# Patient Record
Sex: Female | Born: 1935 | Race: White | Hispanic: No | Smoking: Never smoker
Health system: Southern US, Community
[De-identification: ages and names within clinical notes are randomized; demographics above are authoritative.]

## PROBLEM LIST (undated history)

## (undated) DIAGNOSIS — K219 Gastro-esophageal reflux disease without esophagitis: Secondary | ICD-10-CM

## (undated) DIAGNOSIS — F329 Major depressive disorder, single episode, unspecified: Secondary | ICD-10-CM

## (undated) DIAGNOSIS — F32A Depression, unspecified: Secondary | ICD-10-CM

## (undated) DIAGNOSIS — Z952 Presence of prosthetic heart valve: Secondary | ICD-10-CM

## (undated) DIAGNOSIS — I5022 Chronic systolic (congestive) heart failure: Secondary | ICD-10-CM

## (undated) DIAGNOSIS — I10 Essential (primary) hypertension: Secondary | ICD-10-CM

## (undated) DIAGNOSIS — E785 Hyperlipidemia, unspecified: Secondary | ICD-10-CM

## (undated) HISTORY — PX: AORTIC VALVE REPLACEMENT: SHX41

## (undated) HISTORY — PX: PARATHYROID EXPLORATION: SHX732

## (undated) HISTORY — PX: ABDOMINAL HYSTERECTOMY: SHX81

## (undated) HISTORY — PX: BREAST LUMPECTOMY: SHX2

---

## 2014-05-31 ENCOUNTER — Other Ambulatory Visit: Payer: Self-pay | Admitting: Physician Assistant

## 2014-05-31 ENCOUNTER — Inpatient Hospital Stay: Payer: Self-pay | Admitting: Family Medicine

## 2014-05-31 DIAGNOSIS — I350 Nonrheumatic aortic (valve) stenosis: Secondary | ICD-10-CM

## 2014-05-31 DIAGNOSIS — D649 Anemia, unspecified: Secondary | ICD-10-CM

## 2014-05-31 DIAGNOSIS — I495 Sick sinus syndrome: Secondary | ICD-10-CM

## 2014-05-31 DIAGNOSIS — R7989 Other specified abnormal findings of blood chemistry: Secondary | ICD-10-CM

## 2014-05-31 DIAGNOSIS — I10 Essential (primary) hypertension: Secondary | ICD-10-CM

## 2014-05-31 DIAGNOSIS — I340 Nonrheumatic mitral (valve) insufficiency: Secondary | ICD-10-CM

## 2014-06-16 ENCOUNTER — Other Ambulatory Visit: Payer: Self-pay | Admitting: Family Medicine

## 2014-08-16 NOTE — Consult Note (Signed)
Hgb 10.2, no observed bleeding she has decided not to have any diagnostic studies for her rectal bleeding and is aware she could have a neoplasm in her bowel.   I wlll sign off, reconsult if she changes her mind.   Electronic Signatures: Scot JunElliott, Robert T (MD)  (Signed on 16-Feb-16 10:43)  Authored  Last Updated: 16-Feb-16 10:43 by Scot JunElliott, Robert T (MD)

## 2014-08-16 NOTE — Discharge Summary (Signed)
PATIENT NAME:  [name] Townsend, [name] Townsend MR#:  [id number] DATE OF BIRTH:  [birth year]  DATE OF ADMISSION:  05/31/2014 DATE OF DISCHARGE:  06/03/2014  DISCHARGE DIAGNOSES:  1. Acute right ankle fracture that is casted.  2. Acute anemia secondary to gastrointestinal bleed that is stable.  3. Hypertension.  4. Gastroesophageal reflux disease.   DISCHARGE MEDICATIONS:  1. Citalopram 20 mg 1-1/2 tablets p.o. daily.  2. B12 1000 mcg p.o. daily.  3. Metoprolol tartrate 25 mg p.o. b.i.d.  4. Omeprazole 20 mg p.o. daily.  5. PreserVision 1 capsule daily.  6. Simvastatin 20 mg p.o. at bedtime.  7. Unisom 50 mg p.o. at bedtime as needed for insomnia.  8. Lisinopril 5 mg 1/2 tablet p.o. daily. 9. Folic acid 800 mcg p.o. daily. 10. Acetaminophen 325 mg 2 tablets every 6 hours as needed for pain and fever.   CONSULTS: Orthopedics.   PROCEDURES: None.   PERTINENT LABORATORY AND STUDIES ON THE DAY OF DISCHARGE: Hemoglobin of 10. Prior to discharge, sodium 137, potassium 3.7, creatinine 0.72, glucose 106. Hemoccult positive.   BRIEF HOSPITAL COURSE:  1. Acute right ankle fracture. The patient was evaluated by orthopedics and casted, nonweightbearing for 6 weeks per orthopedics.  2. Acute anemia. The patient has a history of a GI bleed over the past several weeks. Hemoglobin as low as 6.9, was transfused 2 units and hemoglobin has remained stable since that time, which is asymptomatic with no further bleeding. Hemoglobin on discharge was 10. This needs to be rechecked as an outpatient within the next 2 weeks.  3. All her chronic issues are stable at this time. Continue with home regimen, although we are going to hold all anticoagulants including aspirin.   DISPOSITION: She is in stable condition. Discharge to a skilled nursing facility for further rehabilitation. Will need PT and nursing.   She is a FULL CODE.   ____________________________ Marisue IvanKanhka Dynisha Due, MD kl:JT D: 06/03/2014 08:41:56  ET T: 06/03/2014 09:38:17 ET JOB#: [id number]  cc: Marisue IvanKanhka Takelia Urieta, MD, <Dictator> Marisue IvanKANHKA Buffie Herne MD ELECTRONICALLY SIGNED 06/24/2014 16:38

## 2014-08-16 NOTE — Consult Note (Signed)
Pt denies any further bleeding today, eating and in good spirits. she does not know what she wants to do about diagnostic exam for the bleeding.  hgb up to 9.8 with 2 units of blood.  I will see tomorrow, she may want to not do any diagnostic tests.  Electronic Signatures: Scot JunElliott, Robert T (MD)  (Signed on 15-Feb-16 17:38)  Authored  Last Updated: 15-Feb-16 17:38 by Scot JunElliott, Robert T (MD)

## 2014-08-16 NOTE — Consult Note (Signed)
General Aspect Primary Cardiologist: [facility] _______________  81 year old female with history of NICM/chronic systolic CHF with EF 40%, (cath 06/2013), aortic stenosis s/p TAVR 06/2013, HLD, and mood disorder who presented to [facility] on 2/13 with increased dizziness, falls, and intermittent BRBPR (longstanding issue). On 2/13 she fell in the bathroom after feeling dizzy and lightheaded. She suffered a right ankle fracture. In the ED she was found to have a hgb of 7.9-->6.9 (previously 10.2 on 12/2013) and an incidental troponin of 0.06 x 3 without chest pain.  _____________  PMH: 1. NICM/chronic systolic CHF with EF 98%, (cath 06/2013) 2. Aortic stenosis s/p TAVR 06/2013 (MDT 26 mm Core Valve) 3. HLD 4. Mood disorder _______________   Present Illness 81 year old female with the above problem list who presented to [facility] on 2/13 with increased dizziness, falls, and intermittent BRBPR.  She is followed by [facility]. She has known NICM with cardiac cath 06/2013 as part of her TAVR work up showing left main normal, LAD mild luminal irregs (large caliber vessel), LCx mild luminal irregs (large caliber vessel), RCA mild plaque (large caliber vessel). Moderate pulmonary HTN with PAP at 37 mm Hg, PCWP 24 mm Hg. She underwent successful TAVR in 06/2013 which improved her NYHA Class IV symptoms to Class I per [facility] notes. Post procedure she did develop a seroma at the procedure site that is now resolved. She was to be on Plavix for 3 months per [facility] notes, then transition to aspirin 81 mg daily. She continues to take Plavix 75 mg daily at this time. Echo 06/2013 showed TAVR, LVH, EF 45%, mild MR, dilated LA/RA. She has been found to be in sinus tachycardia at her last outpatient cardiology visits. Her bb was increased. She was found to be well compensated from a HF standpoint at that time, thus she was not placed on Lasix per [facility] notes.   Over the last several weeks she has noted increased dizziness with ambulation. She  does walk with a walker. She denies any symptoms with positional changes or while at rest. She denies any chest pain, diaphoresis, nausea, vomiting, or flushing. No palpitations. She has at times had presyncopal/questionable syncopal episodes. She also notes increased SOB over the past several weeks. No orthopnea or early satiety. Her weight has increased by about 15 pounds over the past 3-4 months. No changes in her chronic lower extremity edema. No abdominal swelling. No cough.   She has a long history of BRBPR that she reports is 2/2 hemorrhoids. No formal evaluation. She apparently had a massive bleed within the past week.  While at home on 2/13 she felt dizzy in the bathroom and fell. Denied any chest pain, SOB, diaphoresis, nausea, vomiting, or flushing. She suffered a fractured right ankle. Upon her arrival to the ED she was found to have a hgb of 7.9-->6.9. Troponin 0.06 x 3 (chest pain free). CXR with no acute process, changes 2/2 AVR. She was also noted to have brusing of the right ankle. She was seen by ortho who felt surgery was not indicated and placed ankle in a cast. Cardiology was consulted for further evaluation. She is currently resting comfortably in her room, symptom free.   Physical Exam:  GEN well developed, no acute distress   HEENT HOH   NECK supple   RESP normal resp effort  clear BS   CARD Regular rate and rhythm  No murmur   ABD denies tenderness  soft   LYMPH negative neck  EXTR negative edema, right ankle in cast   SKIN normal to palpation   NEURO motor/sensory function intact   PSYCH alert, A+O to time, place, person, good insight   Review of Systems:  General: No Complaints   Skin: No Complaints   ENT: No Complaints   Eyes: No Complaints   Neck: No Complaints   Respiratory: Short of breath   Cardiovascular: No Complaints   Gastrointestinal: No Complaints   Genitourinary: No Complaints   Vascular: No Complaints   Musculoskeletal:  Muscle or joint pain   Neurologic: Dizzness  Fainting   Hematologic: No Complaints   Endocrine: No Complaints   Psychiatric: No Complaints   Review of Systems: All other systems were reviewed and found to be negative   Medications/Allergies Reviewed Medications/Allergies reviewed   Family & Social History:  Family and Social History:  Family History Negative  mother: breast ca and parkinson's; father leukemia   Social History negative tobacco, positive ETOH, negative Illicit drugs   Place of Living Nursing Home     hypertension:    depression:    aortic valve TAVAR procedure:   Home Medications: Medication Instructions Status  Tylenol 500 mg oral tablet  orally once a day Active  Aspirin Enteric Coated 81 mg oral delayed release tablet 1 tab(s) orally once a day Active  citalopram 20 mg oral tablet 1.5 tab(s) orally once a day Active  clopidogrel 75 mg oral tablet 1 tab(s) orally once a day Active  cyanocobalamin 1000 mcg oral tablet 1 tab(s) orally once a day Active  Metoprolol Tartrate 25 mg oral tablet 1 tab(s) orally 2 times a day Active  naproxen sodium 220 mg oral capsule 2 tab(s) orally once a day, As Needed Active  omeprazole 20 mg oral delayed release capsule 1 cap(s) orally once a day Active  permethrin topical 5% topical cream Apply topically to affected area once Active  PreserVision AREDS 2 Antioxidant Multiple Vitamins and Minerals oral capsule 1 cap(s) orally once a day Active  simvastatin 20 mg oral tablet 1 tab(s) orally once a day (at bedtime) Active  triamcinolone topical 0.5% topical cream Apply topically to affected area 2 times a day Active  Unisom 50 milligram(s) orally once a day (at bedtime), As Needed Active  zinc gluconate 50 mg oral tablet  orally once a day Active  lisinopril 5 mg oral tablet 0.5 tab(s) orally once a day Active  folic acid 026 microgram(s) orally once a day Active   Lab Results:  Thyroid:  14-Feb-16 03:19   Thyroid  Stimulating Hormone 1.94 (0.45-4.50 (IU = International Unit)  ----------------------- Pregnant patients have  different reference  ranges for TSH:  - - - - - - - - - -  Pregnant, first trimetser:  0.36 - 2.50 uIU/mL)  Routine Chem:  14-Feb-16 03:19   Glucose, Serum  105  BUN 17  Creatinine (comp) 0.73  Sodium, Serum 143  Potassium, Serum 3.5  Chloride, Serum  108  CO2, Serum 25  Calcium (Total), Serum  7.7  Anion Gap 10  Osmolality (calc) 287  eGFR (African American) >60  eGFR (Non-African American) >60 (eGFR values <36m/min/1.73 m2 may be an indication of chronic kidney disease (CKD). Calculated eGFR, using the MRDR Study equation, is useful in  patients with stable renal function. The eGFR calculation will not be reliable in acutely ill patients when serum creatinine is changing rapidly. It is not useful in patients on dialysis. The eGFR calculation may not be applicable to patients  at the low and high extremes of body sizes, pregnant women, and vegetarians.)  Cardiac:  13-Feb-16 13:16   Troponin I  0.06 (0.00-0.05 0.05 ng/mL or less: NEGATIVE  Repeat testing in 3-6 hrs  if clinically indicated. >0.05 ng/mL: POTENTIAL  MYOCARDIAL INJURY. Repeat  testing in 3-6 hrs if  clinically indicated. NOTE: An increase or decrease  of 30% or more on serial  testing suggests a  clinically important change)    17:05   Troponin I  0.06 (0.00-0.05 0.05 ng/mL or less: NEGATIVE  Repeat testing in 3-6 hrs  if clinically indicated. >0.05 ng/mL: POTENTIAL  MYOCARDIAL INJURY. Repeat  testing in 3-6 hrs if  clinically indicated. NOTE: An increase or decrease  of 30% or more on serial  testing suggests a  clinically important change)  Routine Hem:  14-Feb-16 03:19   WBC (CBC) 10.8  RBC (CBC)  3.34  Hemoglobin (CBC)  6.9  Hematocrit (CBC)  22.4  Platelet Count (CBC) 385  MCV  67  MCH  20.5  MCHC  30.6  RDW  18.4  Neutrophil % 71.4  Lymphocyte % 11.8  Monocyte % 10.8   Eosinophil % 5.2  Basophil % 0.8  Neutrophil #  7.7  Lymphocyte # 1.3  Monocyte #  1.2  Eosinophil # 0.6  Basophil # 0.1 (Result(s) reported on 31 May 2014 at 04:00AM.)   EKG:  EKG Interp. by me   Interpretation EKG pending. tele shows NSR with APCs   Radiology Results:  XRay:    13-Feb-16 09:54, Chest PA and Lateral  Chest PA and Lateral   REASON FOR EXAM:    sob  COMMENTS:       PROCEDURE: DXR - DXR CHEST PA (OR AP) AND LATERAL  - May 30 2014  9:54AM     CLINICAL DATA:  Dizziness for 1 week, fell, ankle fracture    EXAM:  CHEST - 2 VIEW    COMPARISON:  None available    FINDINGS:  Changes of percutaneous AVR. Atheromatous aorta. Lungs are clear.  Heart size and mediastinal contours are within normal limits.  No effusion.  No pneumothorax.  Mild degenerative changes in the lower thoracic spine. Surgical  clips anteriorly at the thoracic inlet.     IMPRESSION:  1. No acute cardiopulmonary disease.  2. Postop changes as above.      Electronically Signed    By: Lucrezia Europe M.D.    On: 05/30/2014 10:06         Verified By: Kandis Cocking, M.D.,    13-Feb-16 13:02, Ankle Right Complete  Ankle Right Complete   REASON FOR EXAM:    post-reduction fracture evaluation  COMMENTS:   Bedside (portable):Y    PROCEDURE: DXR - DXR ANKLE RIGHT COMPLETE  - May 30 2014  1:02PM     CLINICAL DATA:  Status post reduction.    EXAM:  RIGHT ANKLE - COMPLETE 3+ VIEW    COMPARISON:  05/30/2014    FINDINGS:  The ankle is imaged through splinting material. Again noted are  fractures of the lateral and medial malleolus. There has been  interval reduction of medial malleolus. No new fractures are  identified.     IMPRESSION:  Close reduction of bimalleolar fractures      Electronically Signed    By: Nolon Nations M.D.    On: 05/30/2014 13:31         Verified By: Glenice Bow, M.D.,  Cardiology:    14-Feb-16  10:07, Echo Doppler  Echo Doppler   REASON  FOR EXAM:      COMMENTS:       PROCEDURE: [facility] - ECHO DOPPLER COMPLETE(TRANSTHOR)  - May 31 2014 10:07AM     RESULT: Echocardiogram Report    Patient Name:   [name] Townsend Date of Exam: 05/31/2014  Medical Rec #:  [id number]           Custom1:  Date of Birth:  [birth year]        Height:       64.0 in  Patient Age:    25 years         Weight:       150.0 lb  Patient Gender: F                BSA:          1.73 m??    Indications: CHF  Sonographer:    Arville Go RDCS  Referring Phys: Dion Body    Summary:   1. Left ventricular ejection fraction, by visual estimation, is 60 to   65%.   2. Normal global left ventricular systolic function.   3. Impaired relaxation pattern of LV diastolic filling.   4. Mild left ventricular hypertrophy.   5. Normal right ventricular size and systolic function.   6. Mild mitral valve regurgitation.   7. Mild to moderate tricuspid regurgitation.   8. Mildly elevated pulmonary artery systolic pressure.  2D AND M-MODE MEASUREMENTS (normal ranges within parentheses):  Left Ventricle:          Normal  IVSd (2D):      1.16 cm (0.7-1.1)  LVPWd (2D):     1.22 cm (0.7-1.1) Aorta/LA:                  Normal  LVIDd (2D):     4.22 cm (3.4-5.7) Aortic Root (2D): 2.20 cm (2.4-3.7)  LVIDs (2D):     2.92 cm       Left Atrium (2D): 3.60 cm (1.9-4.0)  LV FS (2D):     30.8 %   (>25%)  LV EF (2D):     58.8 %   (>50%)                                    Right Ventricle:                                    RVd (2D):  LV DIASTOLIC FUNCTION:  MV Peak E: 1.14 m/sE/e' Ratio: 19.50  MV Peak A: 1.47 m/s Decel Time: 201 msec  E/A Ratio: 0.78  SPECTRAL DOPPLER ANALYSIS (where applicable):  Mitral Valve:  MV P1/2 Time: 58.29 msec  MV Area, PHT: 3.77 cm??  Aortic Valve: AoV Max Vel: 2.04 m/s AoV Peak PG: 16.6 mmHg AoV Mean PG:   8.0 mmHg  LVOT Vmax: 1.12 m/s LVOT VTI:  LVOT Diameter: 1.80 cm  AoV Area, Vmax: 1.40 cm?? AoV Area, VTI:  AoV Area, Vmn:  Tricuspid Valve  and PA/RV Systolic Pressure: TR Max Velocity: 3.04 m/s RA   Pressure: 10 mmHg RVSP/PASP: 47.0 mmHg    PHYSICIAN INTERPRETATION:  Left Ventricle: The left ventricular internal cavity size was normal. LV   posterior wall thickness was normal. Mild left ventricular hypertrophy.   Global LV systolic function was normal. Left ventricular  ejection   fraction, by visual estimation, is 60 to 65%. Spectral Doppler shows   impaired relaxation pattern of LV diastolic filling.  Right Ventricle: Normal right ventricular size, wall thickness, and   systolic function. The right ventricular size is normal. Global RV     systolic function is normal.  Left Atrium: The left atrium is normal in size.  Right Atrium: The right atrium is normal in size.  Pericardium: There is no evidence of pericardial effusion.  Mitral Valve: The mitral valve is normal in structure. Mild mitral valve   regurgitation is seen.  Tricuspid Valve: The tricuspid valve is normal. Mild to moderate   tricuspid regurgitation is visualized. The tricuspid regurgitant velocity   is 3.04 m/s, and with an assumed right atrial pressure of 10 mmHg, the   estimated right ventricular systolic pressure is mildly elevated at 47.0   mmHg.  Aortic Valve: The aortic valve is normal. The aortic valve is   structurally normal, with no evidence of sclerosis or stenosis. No   evidence of aortic valve regurgitation is seen.  Pulmonic Valve: The pulmonic valve is normal. Trace pulmonic valve     regurgitation.  Aorta: The aortic root and ascending aorta are structurally normal, with   no evidence of dilitation.    [id number] Ida Rogue MD  Electronically signed by [id number] Ida Rogue MD  Signature Date/Time: 05/31/2014/10:54:58 AM    *** Final ***    IMPRESSION: .        Verified By: Minna Merritts, M.D., MD    Codeine: N/V/Diarrhea  Vital Signs/Nurse's Notes:  **Vital Signs.:   14-Feb-16 08:44  Vital Signs Type Q 4hr  Temperature  Temperature (F) 98  Celsius 36.6  Temperature Source oral  Pulse Pulse 116  Respirations Respirations 18  Systolic BP Systolic BP 196  Diastolic BP (mmHg) Diastolic BP (mmHg) 72  Mean BP 86  Pulse Ox % Pulse Ox % 92  Pulse Ox Activity Level  At rest  Oxygen Delivery Room Air/ 21 %    Impression 81 year old female with history of NICM/chronic systolic CHF with EF 22%, (cath 06/2013), aortic stenosis s/p TAVR 06/2013, HLD, and mood disorder who presented to [facility] on 2/13 with increased dizziness, falls, and intermittent BRBPR (longstanding issue). On 2/13 she fell in the bathroom after feeling dizzy and lightheaded. She suffered a right ankle fracture. In the ED she was found to have a hgb of 7.9-->6.9 (previously 10.2 on 12/2013) and an incidental troponin of 0.06 x 3 without chest pain.  1. Elevated troponin: -Likely demand ischemia in the setting of her anemia (last known hgb 10.2 in 12/2013, presented with hgb of 7.9-->6.9) -Recent cardiac cath 06/2013 at [facility] as part of her TAVR evaluation that showed no significant coronary disease  Normal LV function and wall motion  No further workup at this time  2. Dizziness:  patient on telemetry to monitor for significant arrhythmia, if none seen would schedule outpt follow up.  If sx persist, we would recommend 30 day monitoring  could check orthostatics for continue sx. -Check TSH  3. Anemia: -Last known hgb 10.2 (12/2013), currently 7.9-->6.9 -Massive BRBPR within the past 1 week (long standing h/o hemorrhoids per patient) -Consider GI evaluation  -Consider transfusion to >9 followed by monitoring for stability   4. Aortic stenosis s/p TAVR 06/2013: -Per [facility] notes patient was to stop Plavix 3 months after her procedure, this was delayed 2/2 the development of her seroma. However she did  ultimately complete this in August 2015. Notes are unclear if her primary team wants her on Plavix or not.  -Holding Plavix at the current time 2/2 the above  significant acute anemia -Defer Plavix to primary cardiology team at [facility]  5. Sinus tachycardia: -Likely 2/2 her anemia -Monitor on tele  6. HTN: -Lopressor and lisinopril as above  7. Hypokalemia: -Replete to 4.0   Electronic Signatures: Christell Faith M (PA-C)  (Signed 14-Feb-16 09:01)  Authored: General Aspect/Present Illness, History and Physical Exam, Review of System, Family & Social History, Past Medical History, Home Medications, Labs, EKG , Radiology, Allergies, Vital Signs/Nurse's Notes, Impression/Plan Ida Rogue (MD)  (Signed 14-Feb-16 12:39)  Authored: General Aspect/Present Illness, History and Physical Exam, Family & Social History, Labs, EKG , Radiology, Vital Signs/Nurse's Notes, Impression/Plan  Co-Signer: General Aspect/Present Illness, History and Physical Exam, Review of System, Family & Social History, Past Medical History, Home Medications, Labs, EKG , Radiology, Allergies, Vital Signs/Nurse's Notes, Impression/Plan   Last Updated: 14-Feb-16 12:39 by Ida Rogue (MD)

## 2014-08-16 NOTE — Consult Note (Signed)
PATIENT NAME:  [name] Townsend Townsend, [name] Townsend MR#:  [id number] DATE OF BIRTH:  November 04, 1919  DATE OF CONSULTATION:  05/31/2014  CONSULTING PHYSICIAN:  Scot Junobert T. Azir Muzyka, MD  HISTORY OF PRESENT ILLNESS: The patient is a 81 year old, white female who has had some dizzy spells, fell and has a bimalleolar a fracture of her right ankle. She was also found to have hypochromic microcytic anemia with a hemoglobin of 6.9. She is getting transfused at the time of the interview and the exam. The patient says she has had bright red blood per rectum for several months and thought it was hemorrhoids and did not pay too much attention to it. She did not have abdominal pain with this nor nausea or vomiting. She has been taking Plavix 75 mg a day and aspirin 81 mg a day and Aleve 220 mg once or twice a day which could all have contributed to iron deficiency anemia. She had a colonoscopy, she thinks. 20 years ago or so and has not had one since and does not wish to have a colonoscopy with prep. The patient had an arteriographic procedure with aortic valve replacement via femoral artery access and that is why she is on Plavix and aspirin.   ALLERGIES: CODEINE.  MEDICATIONS:  Aspirin 81 mg a day, Celexa 30 mg a day, Plavix 75 mg a day, vitamin B12 - 1000 mcg a day, folic acid 1 mg a day, lisinopril 5 mg a day, metoprolol 25 mg b.i.d., Naprosyn 220 mg 2 tabs daily as needed, omeprazole 20 mg a day, Tylenol 500 mg a day, Unisom 50 mg at bedtime and zinc gluconate 50 mg a day.   PHYSICAL EXAMINATION: GENERAL: Pleasant, elderly, white female in no acute distress.   HEENT: Sclerae anicteric. Conjunctivae slightly pale.   CHEST: Clear anterior fields.   HEART: Shows a questionable 1/6 systolic murmur.   ABDOMEN: Nontender. No hepatosplenomegaly.   VITAL SIGNS: Temperature 97.3, pulse 95, respirations 20, blood pressure 174/70.   LABORATORY DATA: Troponin slightly elevated at 0.06, felt likely to be possibly demand ischemia given her  anemia. TSH 1.94. Hemoglobin 7.9, repeat is 6.9. She has hypochromic microcytic indices. O positive blood with negative antibody screen. Protime is 14.5, INR 1.1 and she has heme-positive stool.   ASSESSMENT: Iron deficiency anemia in a patient with bleeding for several weeks or months, bright red blood per rectum, thought to be hemorrhoidal outlet bleeding. She could certainly have become anemic from bleeding this much over a long period of time from hemorrhoids, especially given the effects of Plavix, aspirin and Naprosyn. It is certainly possible that she has a neoplasm or large polyp in the lower gastroinestinal tract. She does not want to do a colonoscopy with the usual prep that is involved with it. She thinks she might wish to do a flexible sigmoidoscopy with a Fleet's Enema. I talked to her and her daughter about this possibility. They are going to think on it and I will check back with her tomorrow.    ____________________________ Scot Junobert T. Amyia Lodwick, MD rte:TT D: 05/31/2014 16:57:20 ET T: 05/31/2014 18:15:03 ET JOB#: [id number]  cc: Scot Junobert T. Bayle Calvo, MD, <Dictator> Scot JunOBERT T Mickal Meno MD ELECTRONICALLY SIGNED 06/20/2014 10:50

## 2014-08-16 NOTE — Consult Note (Signed)
Pt with Aortic valve replaced via femoral artery and was on asprin 81mg  and plavix, taking NSAID daily.  Had weakness and dizzyness possibly from worsening anemia from the recurrent daily bleeding with BRB per rectum going on for several weeks.  I discussed possible diagnostic options, she is not interested in a complete colonoscopy and the clean out it woud require but may consider a flex sig with fleets enema. hold plavix and continue ASA 81mg .  Agree with transfusion up o 8 or 9 slowly due to age.  Will check by in a  day or two and see what she wants to do. It is also possible that the NSAID (naprosyn) may contribute to chronic blood loss.  Aortic valve disease may cause RBC to wear out quicker than otherwise, causing iron def.  Electronic Signatures: Scot JunElliott, Robert T (MD)  (Signed on 14-Feb-16 16:51)  Authored  Last Updated: 14-Feb-16 16:51 by Scot JunElliott, Robert T (MD)

## 2014-08-16 NOTE — H&P (Signed)
PATIENT NAME:  [name] Townsend, [name] Townsend MR#:  [id number] DATE OF BIRTH:  1920/03/11  DATE OF ADMISSION:  05/30/2014  PRIMARY CARE PHYSICIAN: Marisue Ivan, MD   CHIEF COMPLAINT: Presyncope and a fall.   HISTORY OF PRESENT ILLNESS: This is a 81 year old female who had a fall earlier today in the bathroom. The patient said she felt sort of dizzy and lightheaded. She has been feeling somewhat dizzy for the past couple of days. Her daughter, who lives very close by to her, was actually staying with her. She heard her mother fall, and she was brought to the ER for further evaluation. In the Emergency Room, the patient was noted to have a right ankle fracture. Incidentally, she was also noted to have an elevated troponin and also noted to be anemic. Hospitalist services were contacted for further treatment and evaluation. The patient denies any abdominal pain, any chest pain. Admits to some nausea, but no vomiting. No headache, no numbness, tingling. No other associated symptoms presently.   REVIEW OF SYSTEMS:  CONSTITUTIONAL: No documented fever. No weight gain, no weight loss.  EYES: No blurry or double vision.  EARS, NOSE, THROAT: No tinnitus. No postnasal drip. No redness of the oropharynx. RESPIRATORY: No cough, no wheeze, no hemoptysis, no dyspnea.  CARDIOVASCULAR: No chest pain, no orthopnea, no palpitations, no true syncope.  GASTROINTESTINAL: Positive nausea. No vomiting. No diarrhea. No abdominal pain. No melena, no hematochezia.  GENITOURINARY: No dysuria or hematuria.  ENDOCRINE: No polyuria or nocturia. No heat or cold intolerance.  HEMATOLOGIC: No anemia, no bruising, no bleeding.  INTEGUMENTARY: No rashes. No lesions.  MUSCULOSKELETAL: No arthritis, no swelling, no gout.  NEUROLOGIC: No numbness or tingling. No ataxia. No seizure-type activity.  PSYCHIATRIC: No anxiety, no insomnia, no ADD. Positive depression.   PAST MEDICAL HISTORY: Consistent with hypertension, hyperlipidemia, GERD,  depression, history of recent aortic valve replacement.   ALLERGIES: CODEINE, WHICH CAUSES NAUSEA AND VOMITING.   SOCIAL HISTORY: No smoking. Occasional alcohol use. No illicit drug abuse. Lives by herself.   FAMILY HISTORY: Mother and father are both deceased. Mother died from breast cancer, and she had Parkinson. Father died from leukemia.   CURRENT MEDICATIONS: As follows: Aspirin 81 mg daily, Celexa 30 mg daily, Plavix 75 mg daily, vitamin B12 1000 mcg daily, folic acid 1 mg daily, lisinopril 5 mg daily, metoprolol tartrate 25 mg b.i.d., Naprosyn 220 mg 2 tablets daily as needed, omeprazole 20 mg daily, permethrin topical to be applied to the affected area as needed, PreserVision 1 tablet daily, simvastatin 20 mg daily, triamcinolone topical cream to be applied to the affected area b.i.d., Tylenol 500 mg daily, Unisom 50 mg daily at bedtime as needed, and zinc gluconate 50 mg daily.   PHYSICAL EXAMINATION: Presently is as follows:  VITAL SIGNS: Noted to be: Temperature is 97.4, pulse 75, respirations 20, blood pressure 159/107, saturations 94% on room air.  GENERAL: She is a pleasant -appearing female in no apparent distress.  HEAD, EYES, EARS, NOSE, AND THROAT: Atraumatic, normocephalic. Extraocular muscles are intact. Pupils equal and reactive on to light. Sclerae anicteric. No conjunctival injection. No pharyngeal erythema.  NECK: Supple. There is no jugular venous distention. No bruits. No lymphadenopathy. No thyromegaly.  HEART: Regular rate and rhythm. No murmurs, no rubs, no clicks.  LUNGS: Clear to auscultation bilaterally. No rales, no rhonchi, no wheezes.  ABDOMEN: Soft, flat, nontender, nondistended. Has good bowel sounds. No hepatosplenomegaly appreciated.   EXTREMITIES: No evidence of cyanosis, clubbing. Trace pedal edema bilaterally. Right  ankle is somewhat swollen.  NEUROLOGICAL: The patient is alert, awake, and oriented x 3 with no focal motor or sensory deficits appreciated  bilaterally.  SKIN: Moist and warm with no rashes.  LYMPHATIC: There is no cervical or axillary lymphadenopathy.    LABORATORY DATA: Serum glucose of 150, BUN 15, creatinine 0.7, sodium 139, potassium 3.7, chloride 106, bicarbonate 24. The patient's LFTs are within normal limits. Troponin 0.06. White cell count 10.2, hemoglobin 7.9, hematocrit 26.5, platelet count 425,000. MCV is 68.   IMAGING STUDIES: The patient did have a chest x-ray done, which showed no acute cardiopulmonary disease. The patient also had an x-ray of the right ankle, which showed unstable bimalleolar fracture with lateral subluxation of the talus.   ASSESSMENT AND PLAN: This is a 81 year old female with a history of hypertension, hyperlipidemia, gastroesophageal reflux disease, depression, history of recent aortic valve replacement, who presents to the hospital after a presyncopal episode/fall and noted to have a right ankle fracture, also incidentally noted to have elevated troponin and also noted to be anemic.  1. Status post fall and right ankle fracture. The patient has already been seen by orthopedics and will likely get a cast placed. Continue pain control as per orthopedics. Most likely, as per orthopedics, she may not need surgery.  2. Elevated troponin. The patient has no acute chest pain. This is likely demand ischemia from the fall. There is no evidence of acute coronary syndrome. EKG shows no acute ST or T-wave changes. We will observe her on off unit telemetry, follow serial cardiac markers. Continue her aspirin, beta blocker, statin, and ACE inhibitor. Hold her Plavix , given her anemia.  3. Anemia. Questionable if this is a chronic or acute. I have no previous hemoglobin to compare with. This is a microcytic anemia. We will check iron TIBC, ferritin, and reticulocyte count. We will also check a Hemoccult. There is no urgent need for transfusion at this time. We will follow serial hemoglobins.  4. Hypertension. Continue  metoprolol and lisinopril.  5. Gastroesophageal reflux disease. Continue Protonix.  6. Hyperlipidemia. Continue simvastatin.  7. Depression. Continue Celexa.   CODE STATUS: The patient is a full code.   TIME SPENT: 50 minutes    ____________________________ Rolly PancakeVivek J. Cherlynn KaiserSainani, MD vjs:mw D: 05/30/2014 12:24:11 ET T: 05/30/2014 13:08:47 ET JOB#: [id number]  cc: Rolly PancakeVivek J. Cherlynn KaiserSainani, MD, <Dictator> Houston SirenVIVEK J SAINANI MD ELECTRONICALLY SIGNED 06/17/2014 15:29

## 2014-08-16 NOTE — Consult Note (Signed)
PATIENT NAME:  [name] Townsend, [name] Townsend MR#:  [id number] DATE OF BIRTH:  October 27, 1919  DATE OF CONSULTATION:  05/30/2014  REASON FOR CONSULTATION: Right ankle fracture subluxation.   HISTORY OF PRESENT ILLNESS: Ms. [name] Townsend is a 81 year old female, who lives in assisted living. The patient had a syncopal episode at home. During the fall, she sustained an injury to the right ankle. She denies any other injuries. She has baseline peripheral neuropathy. The patient states that she has had multiple falls recently including one last week, where she injured the same ankle, but since that time has been able to bear weight on it.   PAST SURGICAL HISTORY: Includes hypertension, depression and an aortic valve replacement procedure.   HOME MEDICATIONS: Include zinc gluconate 50 mg daily, Unisom 50 mg daily, Tylenol 500 mg once daily, triamcinolone topical, which she uses to the affected area b.i.d., simvastatin 20 mg daily, PreserVision AREDS 2  antioxidant multivitamin and mineral oil capsules 1 capsule daily, permethrin topical 5% applied topically once daily, omeprazole 20 mg daily, naproxen sodium 220 mg tablets, she takes 2 tablets daily as needed, metoprolol tartrate 25 mg 1 tablet b.i.d., lisinopril 5 mg daily, folic acid 1 mg daily,                               1000 mg 1 tablet daily, Plavix 75 mg daily, citalopram 20 mg tablet, 1.5 tablets daily, and enteric coated aspirin 81 mg daily.   ALLERGIES: CODEINE.   PHYSICAL EXAMINATION:  RIGHT ANKLE: The patient has external rotation deformity and swelling over the right ankle. The skin is intact. She can flex and extend her toes and has intact sensation to light touch, although she has baseline neuropathy, which has not worsened since the fall. She has palpable pedal pulses. Her foot and leg compartments are soft and compressible.   RADIOLOGY: X-ray films of the right ankle demonstrate a bimalleolar ankle fracture with lateral talar subluxation. There is comminution  at the fracture sites. The fractures are very distal both in the medial malleolus and the lateral malleolus.   ASSESSMENT: Bimalleolar right ankle fracture with lateral talar subluxation.   PLAN: I recommended Ms. [name] Townsend and her daughter, who is with her in the Emergency Room today, that we try a closed reduction and casting for this fracture. The other option would be to proceed with surgery. However, given her age, there are certainly risks associated with surgery including infection, wound healing problems, malunion, nonunion and failure of the hardware. They agreed to the closed reduction.   PROCEDURE: The patient was prepped with Betadine over the anterior ankle. An ankle block was given with a 50/50 mix of 1% lidocaine plain and 0.5% Marcaine plain for a total of 10 mL. The patient then had a closed reduction performed. A short leg cast was applied with 3-point molds. At the time of this dictation, the post reduction x-rays are pending. If the reduction is near anatomic, then she will be treated in a short leg cast and follow up with me in the office in approximately 7 to 10 days for re-evaluation. If the reduction is not adequate, then further discussion regarding surgical treatment will be had. The patient and her daughter understood and agreed with this plan.   ____________________________ Kathreen DevoidKevin L. Fleta Borgeson, MD klk:ap D: 05/30/2014 12:02:21 ET T: 05/30/2014 12:44:01 ET JOB#: [id number]  cc: Kathreen DevoidKevin L. Hillary Struss, MD, <Dictator> Kathreen DevoidKEVIN L Yosselyn Tax MD ELECTRONICALLY SIGNED 06/10/2014 14:45

## 2014-09-30 ENCOUNTER — Emergency Department
Admission: EM | Admit: 2014-09-30 | Discharge: 2014-09-30 | Disposition: A | Discharge status: Home / Self Care | Payer: Medicare PPO | Attending: Emergency Medicine | Admitting: Emergency Medicine

## 2014-09-30 DIAGNOSIS — K625 Hemorrhage of anus and rectum: Secondary | ICD-10-CM

## 2014-09-30 DIAGNOSIS — N3000 Acute cystitis without hematuria: Secondary | ICD-10-CM | POA: Diagnosis not present

## 2014-09-30 DIAGNOSIS — Z79899 Other long term (current) drug therapy: Secondary | ICD-10-CM | POA: Diagnosis not present

## 2014-09-30 DIAGNOSIS — K649 Unspecified hemorrhoids: Secondary | ICD-10-CM | POA: Diagnosis not present

## 2014-09-30 DIAGNOSIS — D649 Anemia, unspecified: Secondary | ICD-10-CM | POA: Diagnosis not present

## 2014-09-30 LAB — COMPREHENSIVE METABOLIC PANEL
ALT: 12 U/L — ABNORMAL LOW (ref 14–54)
ANION GAP: 8 (ref 5–15)
AST: 20 U/L (ref 15–41)
Albumin: 3.2 g/dL — ABNORMAL LOW (ref 3.5–5.0)
Alkaline Phosphatase: 67 U/L (ref 38–126)
BILIRUBIN TOTAL: 0.2 mg/dL — AB (ref 0.3–1.2)
BUN: 18 mg/dL (ref 6–20)
CHLORIDE: 104 mmol/L (ref 101–111)
CO2: 26 mmol/L (ref 22–32)
Calcium: 8.8 mg/dL — ABNORMAL LOW (ref 8.9–10.3)
Creatinine, Ser: 0.73 mg/dL (ref 0.44–1.00)
GFR calc non Af Amer: >60 mL/min (ref >60)
GLUCOSE: 114 mg/dL — AB (ref 65–99)
POTASSIUM: 4.3 mmol/L (ref 3.5–5.1)
Sodium: 138 mmol/L (ref 135–145)
Total Protein: 6.9 g/dL (ref 6.5–8.1)

## 2014-09-30 LAB — URINALYSIS COMPLETE WITH MICROSCOPIC (ARMC ONLY)
BILIRUBIN URINE: NEGATIVE
Bacteria, UA: NONE SEEN
Glucose, UA: NEGATIVE mg/dL
Ketones, ur: NEGATIVE mg/dL
Nitrite: NEGATIVE
PH: 6 (ref 5.0–8.0)
PROTEIN: NEGATIVE mg/dL
Specific Gravity, Urine: 1.015 (ref 1.005–1.030)

## 2014-09-30 LAB — CBC
HCT: 32.9 % — ABNORMAL LOW (ref 35.0–47.0)
Hemoglobin: 10.1 g/dL — ABNORMAL LOW (ref 12.0–16.0)
MCH: 22.3 pg — ABNORMAL LOW (ref 26.0–34.0)
MCHC: 30.7 g/dL — ABNORMAL LOW (ref 32.0–36.0)
MCV: 72.7 fL — ABNORMAL LOW (ref 80.0–100.0)
PLATELETS: 375 10*3/uL (ref 150–440)
RBC: 4.53 MIL/uL (ref 3.80–5.20)
RDW: 22.2 % — AB (ref 11.5–14.5)
WBC: 8.8 10*3/uL (ref 3.6–11.0)

## 2014-09-30 MED ORDER — SULFAMETHOXAZOLE-TRIMETHOPRIM 400-80 MG PO TABS: 1.0000 | ORAL_TABLET | Freq: Two times a day (BID) | ORAL | Status: DC

## 2014-09-30 NOTE — ED Notes (Signed)
Pt from [facility] via EMS for "significant" rectal bleeding at 0800 and 1100. Pt states she has had rectal bleeding in the past, but not this much and not twice in one day. She has never sought treatment for this in the past. States that the water in the toilet was red. Denies pain in abdomen or any other symptoms. Pt allergic to codeine (makes her sick). Alert & oriented with warm, dry skin.

## 2014-09-30 NOTE — ED Notes (Signed)
Pt discharged home after verbalizing understanding of discharge instructions; nad noted. 

## 2014-09-30 NOTE — ED Provider Notes (Signed)
[facility] Emergency Department Provider Note  ____________________________________________  Time seen:    I have reviewed the triage vital signs and the nursing notes.  Initial history was via the patient. The daughter joined [place] after the initial history and physical and provided helpful additional information.  HISTORY  Chief Complaint Rectal Bleeding     HPI [name] Townsend is a 81 y.o. female who noted some rectal bleeding this morning. She reports she got up to go to the bathroom after she awoke. She was not expecting that bowel movement. She had some blood per rectum. Later she went to the toilet again to have a bowel movement. This time she had additional bleeding. She does not give great description in terms of the stool or other aspects of the bowel movement. She does report that the toilet water was red and the blood she noted was bright red.  She reports she has had bleeding per rectum in the past, but usually not twice in one day. She recently was seen by gastroenterology due to anemia. She declined having a colonoscopy per the patient's daughter. She was seen again yesterday in follow-up. A recheck of her hemoglobin showed she had a level of 10.4. This information is again provided by the patient's daughter.  Patient does not have any abdominal pain. She denies any diarrhea. She has no nausea or vomiting. She does have a history of hemorrhoids.    No past medical history on file.  There are no active problems to display for this patient.   No past surgical history on file.  Current Outpatient Rx  Name  Route  Sig  Dispense  Refill  . citalopram (CELEXA) 20 MG tablet   Oral   Take 30 mg by mouth daily.         Marland Kitchen docusate sodium (COLACE) 100 MG capsule   Oral   Take 100 mg by mouth 2 (two) times daily.         Marland Kitchen doxylamine, Sleep, (UNISOM) 25 MG tablet   Oral   Take 25 mg by mouth at bedtime as needed for sleep.         . ferrous  sulfate 325 (65 FE) MG tablet   Oral   Take 325 mg by mouth daily.         . folic acid (FOLVITE) 800 MCG tablet   Oral   Take 800 mcg by mouth daily.         Marland Kitchen lisinopril (PRINIVIL,ZESTRIL) 2.5 MG tablet   Oral   Take 5 mg by mouth daily.         . metoprolol tartrate (LOPRESSOR) 25 MG tablet   Oral   Take 25 mg by mouth 2 (two) times daily.         . Multiple Vitamins-Minerals (PRESERVISION AREDS PO)   Oral   Take 2 capsules by mouth every morning.         Marland Kitchen omeprazole (PRILOSEC) 20 MG capsule   Oral   Take 20 mg by mouth daily.         . simvastatin (ZOCOR) 20 MG tablet   Oral   Take 20 mg by mouth at bedtime.         . vitamin B-12 (CYANOCOBALAMIN) 500 MCG tablet   Oral   Take 500 mcg by mouth daily.         Marland Kitchen sulfamethoxazole-trimethoprim (BACTRIM) 400-80 MG per tablet   Oral   Take 1 tablet by mouth  2 (two) times daily.   10 tablet   0     Allergies Codeine  No family history on file.  Social History History  Substance Use Topics  . Smoking status: Not on file  . Smokeless tobacco: Not on file  . Alcohol Use: Not on file    Review of Systems Constitutional: Negative for fever. ENT: Negative for sore throat. Cardiovascular: Negative for chest pain. Respiratory: Negative for shortness of breath. Gastrointestinal: Negative for abdominal pain, vomiting and diarrhea. Positive for blood per rectum. See history of present illness Genitourinary: Negative for dysuria. Musculoskeletal: Negative for back pain. Skin: Negative for rash. Neurological: Negative for headaches Hematological:  History of anemia  10-point ROS otherwise negative.  ____________________________________________   PHYSICAL EXAM:  VITAL SIGNS: ED Triage Vitals  Enc Vitals Group     BP --      Pulse --      Resp --      Temp --      Temp src --      SpO2 --      Weight --      Height --      Head Cir --      Peak Flow --      Pain Score --      Pain  Loc --      Pain Edu? --      Excl. in GC? --     Constitutional:  Alert and oriented. Well appearing and in no distress. ENT   Head: Normocephalic and atraumatic.   Nose: No congestion/rhinnorhea.   Mouth/Throat: Mucous membranes are moist. Cardiovascular: Normal rate, regular rhythm. Respiratory: Normal respiratory effort without tachypnea. Breath sounds are clear and equal bilaterally. No wheezes/rales/rhonchi. Gastrointestinal: Soft and nontender. No distention.  Rectal:  Numerous hemorrhoids. None of them appear to be thrombosed. Nontender. Digital exam does show a scant amount of bright red blood which is confirmed heme positive by Hemoccult testing. Back: No muscle spasm, no tenderness, no CVA tenderness. Musculoskeletal: Nontender with normal range of motion in all extremities.  No noted edema. Neurologic:  Normal speech and language. No gross focal neurologic deficits are appreciated.  Skin:  Skin is warm, dry. No rash noted. Psychiatric: Mood and affect are normal. Speech and behavior are normal.  ____________________________________________    LABS (pertinent positives/negatives)  CBC: White blood cell count 8.8, hemoglobin 10.1 Metabolic panel within normal limits except for glucose slightly elevated at 114. Urinalysis shows 6-30 white blood cell And 2+ leukocyte esterase   Review of her urine culture from Monday shows Proteus present with greater than 100,000 colony-forming units. The species is broadly sensitive including to Bactrim. ____________________________________________ ____________________________________________   INITIAL IMPRESSION / ASSESSMENT AND PLAN / ED COURSE  Primarily, I suspect this is bleeding from a hemorrhoid. She has no other GI symptoms (no abdominal pain, no nausea or vomiting, no melena, no diarrhea). She does have noted hemorrhoids on exam. If her blood count is stable, we have discussed admission to the hospital for observation  versus discharge home with close follow-up. The patient prefers discharge home. Disposition pending along with lab tests.    ----------------------------------------- 5:19 PM on 09/30/2014 -----------------------------------------  I have discussed this patient's situation with Dr. Shelle Iron. He recently saw the patient knows her. The patient had turned down any further evaluation. He will make time for the patient to be seen on Friday for a flexible sigmoidoscopy. I discussed this plan with the patient and her daughter. The  daughter is in agreement with this, however the patient is refusing any further evaluation including the flexible sigmoidoscopy. I have offered admission to the hospital for observation due to her rectal bleeding. She has declined this and does not want to stay in the hospital. She does look well and is hemodynamically stable. Her hemoglobin is extremely close to what it was yesterday. The daughter understands my offers and the risks involved.  I will place the patient on Bactrim due to her positive urine culture as requested by the daughter. The daughter had been informed of the positive culture from [facility] clinic earlier today.  ____________________________________________   FINAL CLINICAL IMPRESSION(S) / ED DIAGNOSES  Final diagnoses:  Rectal bleeding  Anemia, unspecified anemia type  Acute cystitis without hematuria      Darien Ramus, MD 09/30/14 1753

## 2014-09-30 NOTE — Discharge Instructions (Signed)
Your urine culture does show a notable urinary tract infection. Take Bactrim as prescribed. We have discussed staying in the hospital or being discharged with follow-up. He preferred to go home. You have expressed not wanted to have the flexible sigmoidoscopy that we have discussed. Please discuss this further with her family and called Dr. Teddy Spike office to make arrangements for follow-up. Return to the emergency department if you have worsening bleeding, weakness, or other urgent concerns.  Gastrointestinal Bleeding Gastrointestinal (GI) bleeding means there is bleeding somewhere along the digestive tract, between the mouth and anus. CAUSES  There are many different problems that can cause GI bleeding. Possible causes include:  Esophagitis. This is inflammation, irritation, or swelling of the esophagus.  Hemorrhoids.These are veins that are full of blood (engorged) in the rectum. They cause pain, inflammation, and may bleed.  Anal fissures.These are areas of painful tearing which may bleed. They are often caused by passing hard stool.  Diverticulosis.These are pouches that form on the colon over time, with age, and may bleed significantly.  Diverticulitis.This is inflammation in areas with diverticulosis. It can cause pain, fever, and bloody stools, although bleeding is rare.  Polyps and cancer. Colon cancer often starts out as precancerous polyps.  Gastritis and ulcers.Bleeding from the upper gastrointestinal tract (near the stomach) may travel through the intestines and produce black, sometimes tarry, often bad smelling stools. In certain cases, if the bleeding is fast enough, the stools may not be black, but red. This condition may be life-threatening. SYMPTOMS   Vomiting bright red blood or material that looks like coffee grounds.  Bloody, black, or tarry stools. DIAGNOSIS  Your caregiver may diagnose your condition by taking your history and performing a physical exam. More  tests may be needed, including:  X-rays and other imaging tests.  Esophagogastroduodenoscopy (EGD). This test uses a flexible, lighted tube to look at your esophagus, stomach, and small intestine.  Colonoscopy. This test uses a flexible, lighted tube to look at your colon. TREATMENT  Treatment depends on the cause of your bleeding.   For bleeding from the esophagus, stomach, small intestine, or colon, the caregiver doing your EGD or colonoscopy may be able to stop the bleeding as part of the procedure.  Inflammation or infection of the colon can be treated with medicines.  Many rectal problems can be treated with creams, suppositories, or warm baths.  Surgery is sometimes needed.  Blood transfusions are sometimes needed if you have lost a lot of blood. If bleeding is slow, you may be allowed to go home. If there is a lot of bleeding, you will need to stay in the hospital for observation. HOME CARE INSTRUCTIONS   Take any medicines exactly as prescribed.  Keep your stools soft by eating foods that are high in fiber. These foods include whole grains, legumes, fruits, and vegetables. Prunes (1 to 3 a day) work well for many people.  Drink enough fluids to keep your urine clear or pale yellow. SEEK IMMEDIATE MEDICAL CARE IF:   Your bleeding increases.  You feel lightheaded, weak, or you faint.  You have severe cramps in your back or abdomen.  You pass large blood clots in your stool.  Your problems are getting worse. MAKE SURE YOU:   Understand these instructions.  Will watch your condition.  Will get help right away if you are not doing well or get worse. Document Released: 03/31/2000 Document Revised: 03/20/2012 Document Reviewed: 03/13/2011 [employer] Patient Information 2015 [place], [place]. This information is  not intended to replace advice given to you by your health care provider. Make sure you discuss any questions you have with your health care provider. ° °

## 2015-06-14 ENCOUNTER — Encounter: Payer: Self-pay | Admitting: *Deleted

## 2015-06-14 ENCOUNTER — Emergency Department: Payer: Commercial Managed Care - HMO

## 2015-06-14 ENCOUNTER — Observation Stay
Admission: EM | Admit: 2015-06-14 | Discharge: 2015-06-16 | Disposition: A | Discharge status: Home / Self Care | Payer: Commercial Managed Care - HMO | Attending: Internal Medicine | Admitting: Internal Medicine

## 2015-06-14 DIAGNOSIS — W19XXXA Unspecified fall, initial encounter: Secondary | ICD-10-CM

## 2015-06-14 DIAGNOSIS — R748 Abnormal levels of other serum enzymes: Secondary | ICD-10-CM | POA: Diagnosis not present

## 2015-06-14 DIAGNOSIS — I10 Essential (primary) hypertension: Secondary | ICD-10-CM | POA: Diagnosis not present

## 2015-06-14 DIAGNOSIS — I739 Peripheral vascular disease, unspecified: Secondary | ICD-10-CM | POA: Insufficient documentation

## 2015-06-14 DIAGNOSIS — R42 Dizziness and giddiness: Secondary | ICD-10-CM | POA: Insufficient documentation

## 2015-06-14 DIAGNOSIS — R05 Cough: Secondary | ICD-10-CM | POA: Diagnosis not present

## 2015-06-14 DIAGNOSIS — R296 Repeated falls: Secondary | ICD-10-CM | POA: Diagnosis not present

## 2015-06-14 DIAGNOSIS — I454 Nonspecific intraventricular block: Secondary | ICD-10-CM | POA: Insufficient documentation

## 2015-06-14 DIAGNOSIS — Z9889 Other specified postprocedural states: Secondary | ICD-10-CM | POA: Insufficient documentation

## 2015-06-14 DIAGNOSIS — I5022 Chronic systolic (congestive) heart failure: Secondary | ICD-10-CM | POA: Diagnosis not present

## 2015-06-14 DIAGNOSIS — H7090 Unspecified mastoiditis, unspecified ear: Secondary | ICD-10-CM | POA: Insufficient documentation

## 2015-06-14 DIAGNOSIS — F329 Major depressive disorder, single episode, unspecified: Secondary | ICD-10-CM | POA: Insufficient documentation

## 2015-06-14 DIAGNOSIS — E785 Hyperlipidemia, unspecified: Secondary | ICD-10-CM | POA: Diagnosis present

## 2015-06-14 DIAGNOSIS — I214 Non-ST elevation (NSTEMI) myocardial infarction: Secondary | ICD-10-CM | POA: Diagnosis present

## 2015-06-14 DIAGNOSIS — K219 Gastro-esophageal reflux disease without esophagitis: Secondary | ICD-10-CM | POA: Diagnosis not present

## 2015-06-14 DIAGNOSIS — R778 Other specified abnormalities of plasma proteins: Secondary | ICD-10-CM | POA: Diagnosis present

## 2015-06-14 DIAGNOSIS — I429 Cardiomyopathy, unspecified: Secondary | ICD-10-CM | POA: Diagnosis not present

## 2015-06-14 DIAGNOSIS — I4510 Unspecified right bundle-branch block: Secondary | ICD-10-CM | POA: Diagnosis not present

## 2015-06-14 DIAGNOSIS — F32A Depression, unspecified: Secondary | ICD-10-CM | POA: Diagnosis present

## 2015-06-14 DIAGNOSIS — Z806 Family history of leukemia: Secondary | ICD-10-CM | POA: Diagnosis not present

## 2015-06-14 DIAGNOSIS — Z79899 Other long term (current) drug therapy: Secondary | ICD-10-CM | POA: Insufficient documentation

## 2015-06-14 DIAGNOSIS — Z90710 Acquired absence of both cervix and uterus: Secondary | ICD-10-CM | POA: Insufficient documentation

## 2015-06-14 DIAGNOSIS — R2681 Unsteadiness on feet: Secondary | ICD-10-CM

## 2015-06-14 DIAGNOSIS — I951 Orthostatic hypotension: Secondary | ICD-10-CM | POA: Diagnosis not present

## 2015-06-14 DIAGNOSIS — I639 Cerebral infarction, unspecified: Secondary | ICD-10-CM

## 2015-06-14 DIAGNOSIS — Z8249 Family history of ischemic heart disease and other diseases of the circulatory system: Secondary | ICD-10-CM | POA: Diagnosis not present

## 2015-06-14 DIAGNOSIS — Z953 Presence of xenogenic heart valve: Secondary | ICD-10-CM | POA: Diagnosis not present

## 2015-06-14 DIAGNOSIS — Z952 Presence of prosthetic heart valve: Secondary | ICD-10-CM | POA: Diagnosis not present

## 2015-06-14 DIAGNOSIS — J329 Chronic sinusitis, unspecified: Secondary | ICD-10-CM | POA: Diagnosis not present

## 2015-06-14 DIAGNOSIS — R918 Other nonspecific abnormal finding of lung field: Secondary | ICD-10-CM | POA: Insufficient documentation

## 2015-06-14 DIAGNOSIS — R7989 Other specified abnormal findings of blood chemistry: Secondary | ICD-10-CM | POA: Diagnosis present

## 2015-06-14 HISTORY — DX: Gastro-esophageal reflux disease without esophagitis: K21.9

## 2015-06-14 HISTORY — DX: Depression, unspecified: F32.A

## 2015-06-14 HISTORY — DX: Chronic systolic (congestive) heart failure: I50.22

## 2015-06-14 HISTORY — DX: Presence of prosthetic heart valve: Z95.2

## 2015-06-14 HISTORY — DX: Essential (primary) hypertension: I10

## 2015-06-14 HISTORY — DX: Major depressive disorder, single episode, unspecified: F32.9

## 2015-06-14 HISTORY — DX: Hyperlipidemia, unspecified: E78.5

## 2015-06-14 LAB — BASIC METABOLIC PANEL
ANION GAP: 10 (ref 5–15)
BUN: 15 mg/dL (ref 6–20)
CHLORIDE: 102 mmol/L (ref 101–111)
CO2: 27 mmol/L (ref 22–32)
Calcium: 9.2 mg/dL (ref 8.9–10.3)
Creatinine, Ser: 0.55 mg/dL (ref 0.44–1.00)
GFR calc non Af Amer: >60 mL/min (ref >60)
Glucose, Bld: 119 mg/dL — ABNORMAL HIGH (ref 65–99)
Potassium: 3.7 mmol/L (ref 3.5–5.1)
Sodium: 139 mmol/L (ref 135–145)

## 2015-06-14 LAB — CBC
HCT: 40 % (ref 35.0–47.0)
HEMOGLOBIN: 13.2 g/dL (ref 12.0–16.0)
MCH: 28.1 pg (ref 26.0–34.0)
MCHC: 33.1 g/dL (ref 32.0–36.0)
MCV: 84.9 fL (ref 80.0–100.0)
Platelets: 324 10*3/uL (ref 150–440)
RBC: 4.71 MIL/uL (ref 3.80–5.20)
RDW: 14.2 % (ref 11.5–14.5)
WBC: 9.3 10*3/uL (ref 3.6–11.0)

## 2015-06-14 LAB — URINALYSIS COMPLETE WITH MICROSCOPIC (ARMC ONLY)
Bilirubin Urine: NEGATIVE
Glucose, UA: NEGATIVE mg/dL
Ketones, ur: NEGATIVE mg/dL
Nitrite: NEGATIVE
PROTEIN: 100 mg/dL — AB
Specific Gravity, Urine: 1.01 (ref 1.005–1.030)
pH: 6 (ref 5.0–8.0)

## 2015-06-14 LAB — TROPONIN I: Troponin I: 0.16 ng/mL — ABNORMAL HIGH (ref <0.031)

## 2015-06-14 MED ORDER — METOPROLOL TARTRATE 25 MG PO TABS
25.0000 mg | ORAL_TABLET | Freq: Once | ORAL | Status: AC
  Administered 2015-06-14: 25 mg via ORAL
  Filled 2015-06-14: qty 1

## 2015-06-14 MED ORDER — LABETALOL HCL 5 MG/ML IV SOLN
10.0000 mg | Freq: Once | INTRAVENOUS | Status: AC
Start: 2015-06-14 — End: 2015-06-14
  Administered 2015-06-14: 10 mg via INTRAVENOUS
  Filled 2015-06-14: qty 4

## 2015-06-14 MED ORDER — ASPIRIN 81 MG PO CHEW
324.0000 mg | CHEWABLE_TABLET | Freq: Once | ORAL | Status: AC
  Administered 2015-06-14: 324 mg via ORAL
  Filled 2015-06-14: qty 4

## 2015-06-14 NOTE — H&P (Signed)
[facility] [facility]   PATIENT NAME: [name] Townsend    MR#:  [id number]  DATE OF BIRTH:  1919/05/28  DATE OF ADMISSION:  06/14/2015  PRIMARY CARE PHYSICIAN: Marisue Ivan, MD   REQUESTING/REFERRING PHYSICIAN: Inocencio Homes, MD  CHIEF COMPLAINT:   Chief Complaint  Patient presents with  . Dizziness    HISTORY OF PRESENT ILLNESS:  [name] Townsend  is a 82 y.o. female who presents with 2 falls over the last 2 days. Patient states that both times she fell in her bathroom. She denies any loss of consciousness with either of these falls. She states that one of the time she fell it was just after urinating. She describes the episode as having happened after she stood up, she became "foggy headed", and then fell backwards. She denies any chest pain, shortness of breath, diaphoresis, nausea and vomiting, headache, blurred vision. After second fall she decided to come in to the ED for evaluation. Initial workup here is largely benign except for a troponin mildly elevated 0.16. Patient has a history of nonischemic cardiomyopathy as well as TAVR. However, her EKG here does show a left anterior fascicular block as well as a bundle branch block. She is also notably hypertensive on arrival to the ED.  Hospitalists were called for admission.  PAST MEDICAL HISTORY:   Past Medical History  Diagnosis Date  . Aortic valve replaced   . Hypertension   . HLD (hyperlipidemia)   . GERD (gastroesophageal reflux disease)   . Depression   . Chronic systolic CHF (congestive heart failure) (HCC)     PAST SURGICAL HISTORY:   Past Surgical History  Procedure Laterality Date  . Abdominal hysterectomy    . Parathyroid exploration      tumor removal  . Breast lumpectomy      benign lesion  . Aortic valve replacement      TAVR    SOCIAL HISTORY:   Social History  Substance Use Topics  . Smoking status: Never Smoker   . Smokeless tobacco: Not on file  . Alcohol  Use: No    FAMILY HISTORY:   Family History  Problem Relation Age of Onset  . Leukemia Father   . Cancer Sister   . Heart attack Sister     DRUG ALLERGIES:   Allergies  Allergen Reactions  . Codeine Nausea And Vomiting    MEDICATIONS AT HOME:   Prior to Admission medications   Medication Sig Start Date End Date Taking? Authorizing Provider  citalopram (CELEXA) 20 MG tablet Take 30 mg by mouth daily.   Yes Historical Provider, MD  doxylamine, Sleep, (UNISOM) 25 MG tablet Take 25 mg by mouth at bedtime as needed for sleep.   Yes Historical Provider, MD  ferrous sulfate 325 (65 FE) MG tablet Take 325 mg by mouth 2 (two) times daily with a meal.    Yes Historical Provider, MD  folic acid (FOLVITE) 800 MCG tablet Take 800 mcg by mouth daily.   Yes Historical Provider, MD  furosemide (LASIX) 20 MG tablet Take 20 mg by mouth daily as needed for edema.   Yes Historical Provider, MD  lisinopril (PRINIVIL,ZESTRIL) 2.5 MG tablet Take 2.5 mg by mouth daily.    Yes Historical Provider, MD  metoprolol tartrate (LOPRESSOR) 25 MG tablet Take 25 mg by mouth 2 (two) times daily.   Yes Historical Provider, MD  Multiple Vitamins-Minerals (PRESERVISION AREDS PO) Take 2 capsules by mouth daily.    Yes Historical  Provider, MD  omeprazole (PRILOSEC) 20 MG capsule Take 20 mg by mouth daily.   Yes Historical Provider, MD  simvastatin (ZOCOR) 20 MG tablet Take 20 mg by mouth at bedtime.   Yes Historical Provider, MD  vitamin B-12 (CYANOCOBALAMIN) 500 MCG tablet Take 500 mcg by mouth daily.   Yes Historical Provider, MD    REVIEW OF SYSTEMS:  Review of Systems  Constitutional: Negative for fever, chills, weight loss and malaise/fatigue.  HENT: Negative for ear pain, hearing loss and tinnitus.   Eyes: Negative for blurred vision, double vision, pain and redness.  Respiratory: Negative for cough, hemoptysis and shortness of breath.   Cardiovascular: Negative for chest pain, palpitations, orthopnea and  leg swelling.  Gastrointestinal: Negative for nausea, vomiting, abdominal pain, diarrhea and constipation.  Genitourinary: Negative for dysuria, frequency and hematuria.  Musculoskeletal: Positive for falls. Negative for back pain, joint pain and neck pain.  Skin:       No acne, rash, or lesions  Neurological: Positive for dizziness. Negative for tremors, focal weakness and weakness.  Endo/Heme/Allergies: Negative for polydipsia. Does not bruise/bleed easily.  Psychiatric/Behavioral: Negative for depression. The patient is not nervous/anxious and does not have insomnia.      VITAL SIGNS:   Filed Vitals:   06/14/15 1900 06/14/15 1930 06/14/15 2042 06/14/15 2100  BP: 174/98 190/76 217/93 221/92  Pulse: 87 76 80 78  Temp:      TempSrc:      Resp: 25 20  20   Height:      Weight:      SpO2: 96% 98%  95%   Wt Readings from Last 3 Encounters:  06/14/15 68.04 kg (150 lb)  09/30/14 69.854 kg (154 lb)    PHYSICAL EXAMINATION:  Physical Exam  Vitals reviewed. Constitutional: She is oriented to person, place, and time. She appears well-developed and well-nourished. No distress.  HENT:  Head: Normocephalic and atraumatic.  Mouth/Throat: Oropharynx is clear and moist.  Eyes: Conjunctivae and EOM are normal. Pupils are equal, round, and reactive to light. No scleral icterus.  Neck: Normal range of motion. Neck supple. No JVD present. No thyromegaly present.  Cardiovascular: Normal rate, regular rhythm and intact distal pulses.  Exam reveals no gallop and no friction rub.   No murmur heard. Respiratory: Effort normal and breath sounds normal. No respiratory distress. She has no wheezes. She has no rales.  GI: Soft. Bowel sounds are normal. She exhibits no distension. There is no tenderness.  Musculoskeletal: Normal range of motion. She exhibits edema (BL lower extremity).  No arthritis, no gout  Lymphadenopathy:    She has no cervical adenopathy.  Neurological: She is alert and oriented  to person, place, and time. No cranial nerve deficit.  No dysarthria, no aphasia  Skin: Skin is warm and dry. No rash noted. No erythema.  Psychiatric: She has a normal mood and affect. Her behavior is normal. Judgment and thought content normal.    LABORATORY PANEL:   CBC  Recent Labs Lab 06/14/15 1758  WBC 9.3  HGB 13.2  HCT 40.0  PLT 324   ------------------------------------------------------------------------------------------------------------------  Chemistries   Recent Labs Lab 06/14/15 1758  NA 139  K 3.7  CL 102  CO2 27  GLUCOSE 119*  BUN 15  CREATININE 0.55  CALCIUM 9.2   ------------------------------------------------------------------------------------------------------------------  Cardiac Enzymes  Recent Labs Lab 06/14/15 1718  TROPONINI 0.16*   ------------------------------------------------------------------------------------------------------------------  RADIOLOGY:  Dg Chest 2 View  06/14/2015  CLINICAL DATA:  Cough.  Patient fell  yesterday due to dizziness. EXAM: CHEST  2 VIEW COMPARISON:  05/30/2014 FINDINGS: Evidence of previous percutaneous aortic valve replacement. Multiple surgical clips in the region of the thyroid. Heart size and pulmonary vascularity are normal. Extensive calcification in the thoracic aorta. Nodular density at the superior aspect of the right hilum is most likely a granuloma and appears unchanged in size since the prior study, 15 mm. Slight increased density posteriorly on the lateral view could represent focal atelectasis or patchy infiltrate. No acute osseous abnormality. IMPRESSION: On the lateral view there is vague increased density posteriorly at 1 of the bases which probably represents atelectasis although less likely could represent a small focal infiltrate. Electronically Signed   By: Francene Boyers M.D.   On: 06/14/2015 19:41   Ct Head Wo Contrast  06/14/2015  CLINICAL DATA:  States yesterday she had 2 episodes  of dizziness and she fell today, states she hit her head but denies any use of blood thinners, denies LOC, pt awake and alert upon arrival. EXAM: CT HEAD WITHOUT CONTRAST TECHNIQUE: Contiguous axial images were obtained from the base of the skull through the vertex without intravenous contrast. COMPARISON:  None. FINDINGS: The ventricles are normal in configuration. There is ventricular and sulcal enlargement reflecting moderate generalized atrophy. No hydrocephalus. There are no parenchymal masses or mass effect. There is no evidence of a cortical infarct. White matter hypoattenuation is noted consistent with advanced chronic microvascular ischemic change. There are no extra-axial masses or abnormal fluid collections. There is no intracranial hemorrhage. Visualized sinuses and mastoid air cells are clear. No skull fracture. IMPRESSION: 1. No acute intracranial abnormalities. 2. Atrophy and chronic microvascular ischemic change. Electronically Signed   By: Amie Portland M.D.   On: 06/14/2015 18:31    EKG:   Orders placed or performed during the hospital encounter of 06/14/15  . ED EKG  . ED EKG  . EKG 12-Lead  . EKG 12-Lead    IMPRESSION AND PLAN:  Principal Problem:   Elevated troponin - mildly elevated 0.16. Suspect this is likely more related to the patient's chronic systolic CHF in the setting of accelerated hypertension than true ACS. However, we will bring her in for observation, and trend her cardiac enzymes tonight. We will get an echocardiogram in the morning to evaluate, as well as for evaluation of her aortic valve, and we will also get a cardiology consult. Active Problems:   Multiple falls - these are potentially vasovagal episodes, though the patient does not describe true syncope she does describe something similar to a presyncopal episode. These may also be related to her accelerated hypertension. Will monitor on telemetry tonight for any signs of arrhythmias.   Accelerated  hypertension - control her blood pressure tonight. We'll bring it down to systolic of less than 180 the first 12 hours, and then he can be brought down to a goal of less than 160/100.   Chronic systolic CHF (congestive heart failure) (HCC) - continue home meds for this, evaluate with echocardiogram in the morning. Does not seem to be in exacerbation.   HLD (hyperlipidemia) - continue  the home meds   Depression - Continue home meds   GERD (gastroesophageal reflux disease) -  home dose PPI   All the records are reviewed and case discussed with ED provider. Management plans discussed with the patient and/or family.  DVT PROPHYLAXIS: SubQ lovenox  GI PROPHYLAXIS: PPI  ADMISSION STATUS: Observation  CODE STATUS: Full Code Status History    This patient  does not have a recorded code status. Please follow your organizational policy for patients in this situation.    Advance Directive Documentation        Most Recent Value   Type of Advance Directive  Healthcare Power of Attorney, Living will   Pre-existing out of facility DNR order (yellow form or pink MOST form)     "MOST" Form in Place?        TOTAL TIME TAKING CARE OF THIS PATIENT: 45 minutes.    Cerra Eisenhower FIELDING 06/14/2015, 9:21 PM  [facility]  Office  [phone]  CC: Primary care physician; Marisue Ivan, MD

## 2015-06-14 NOTE — ED Provider Notes (Signed)
[facility] Emergency Department Provider Note  ____________________________________________  Time seen: Approximately 7:38 PM  I have reviewed the triage vital signs and the nursing notes.   HISTORY  Chief Complaint Dizziness    HPI [name] Townsend is a 82 y.o. female with history NICM/chronic systolic CHF with EF 45%, (cath 06/2013), aortic stenosis s/p TAVR 06/2013, HLD, and mood disorder who presents for evaluation of after falls which occurred yesterday and today, gradual onset, now resolved. The patient reports that yesterday she was in the bathroom, stood up off the toilet, felt lightheaded and "my feet got tangled and I fell". She reports that she did hit her head but she did lose consciousness. This morning at 4 AM something similar happen in the bathroom. She was evaluated by staff at [facility] and appeared okay so she was not referred to the emergency department. Later this afternoon she complained to her daughter that she was feeling somewhat weak and so they brought her to the ER for evaluation. She has had cough over the past several days. She denies any chest pain or difficulty breathing, no vomiting, diarrhea, fevers or chills over the past week or so. No dysuria.   Past Medical History  Diagnosis Date  . Aortic valve replaced   . Hypertension   . HLD (hyperlipidemia)   . GERD (gastroesophageal reflux disease)   . Depression     There are no active problems to display for this patient.   Past Surgical History  Procedure Laterality Date  . Abdominal hysterectomy    . Parathyroid exploration      tumor removal  . Breast lumpectomy      benign lesion  . Aortic valve replacement      TAVR    Current Outpatient Rx  Name  Route  Sig  Dispense  Refill  . citalopram (CELEXA) 20 MG tablet   Oral   Take 30 mg by mouth daily.         Marland Kitchen doxylamine, Sleep, (UNISOM) 25 MG tablet   Oral   Take 25 mg by mouth at bedtime as needed for  sleep.         . ferrous sulfate 325 (65 FE) MG tablet   Oral   Take 325 mg by mouth 2 (two) times daily with a meal.          . folic acid (FOLVITE) 800 MCG tablet   Oral   Take 800 mcg by mouth daily.         . furosemide (LASIX) 20 MG tablet   Oral   Take 20 mg by mouth daily as needed for edema.         Marland Kitchen lisinopril (PRINIVIL,ZESTRIL) 2.5 MG tablet   Oral   Take 2.5 mg by mouth daily.          . metoprolol tartrate (LOPRESSOR) 25 MG tablet   Oral   Take 25 mg by mouth 2 (two) times daily.         . Multiple Vitamins-Minerals (PRESERVISION AREDS PO)   Oral   Take 2 capsules by mouth daily.          Marland Kitchen omeprazole (PRILOSEC) 20 MG capsule   Oral   Take 20 mg by mouth daily.         . simvastatin (ZOCOR) 20 MG tablet   Oral   Take 20 mg by mouth at bedtime.         . vitamin B-12 (CYANOCOBALAMIN) 500  MCG tablet   Oral   Take 500 mcg by mouth daily.           Allergies Codeine  Family History  Problem Relation Age of Onset  . Leukemia Father   . Cancer Sister   . Heart attack Sister     Social History Social History  Substance Use Topics  . Smoking status: Never Smoker   . Smokeless tobacco: None  . Alcohol Use: No    Review of Systems Constitutional: No fever/chills Eyes: No visual changes. ENT: No sore throat. Cardiovascular: Denies chest pain. Respiratory: Denies shortness of breath. Gastrointestinal: No abdominal pain.  No nausea, no vomiting.  No diarrhea.  No constipation. Genitourinary: Negative for dysuria. Musculoskeletal: Negative for back pain. Skin: Negative for rash. Neurological: Negative for headaches, focal weakness or numbness.  10-point ROS otherwise negative.  ____________________________________________   PHYSICAL EXAM:  VITAL SIGNS: ED Triage Vitals  Enc Vitals Group     BP 06/14/15 1755 168/92 mmHg     Pulse Rate 06/14/15 1755 87     Resp 06/14/15 1755 18     Temp 06/14/15 1755 98.3 F (36.8 C)      Temp Source 06/14/15 1755 Oral     SpO2 06/14/15 1856 95 %     Weight 06/14/15 1755 150 lb (68.04 kg)     Height 06/14/15 1755  (1.626 m)     Head Cir --      Peak Flow --      Pain Score 06/14/15 1756 0     Pain Loc --      Pain Edu? --      Excl. in GC? --     Constitutional: Alert and oriented. Well appearing and in no acute distress. Eyes: Conjunctivae are normal. PERRL. EOMI. Head: Atraumatic. Nose: No congestion/rhinnorhea. Mouth/Throat: Mucous membranes are moist.  Oropharynx non-erythematous. Neck: No stridor.  No cervical spine tenderness to palpation. Cardiovascular: Normal rate, regular rhythm. Grossly normal heart sounds.  Good peripheral circulation. Respiratory: Normal respiratory effort.  No retractions. Lungs CTAB. Gastrointestinal: Soft and nontender. No distention.  No CVA tenderness. Genitourinary: deferred Musculoskeletal: Chronic 2+ edema bilateral lower extremities. No joint effusions. No midline T or L-spine tenderness to palpation. Pelvis is stable to rock and compression. Full active painless range of motion of bilateral hip joints. Neurologic:  Normal speech and language. No gross focal neurologic deficits are appreciated. No gait instability. 5 out of 5 strength in bilateral upper and lower extremities, sensation intact to light touch throughout, cranial nerves II through XII intact, normal finger-nose-finger. Skin:  Skin is warm, dry and intact. No rash noted. Psychiatric: Mood and affect are normal. Speech and behavior are normal.  ____________________________________________   LABS (all labs ordered are listed, but only abnormal results are displayed)  Labs Reviewed  BASIC METABOLIC PANEL - Abnormal; Notable for the following:    Glucose, Bld 119 (*)    All other components within normal limits  TROPONIN I - Abnormal; Notable for the following:    Troponin I 0.16 (*)    All other components within normal limits  CBC  URINALYSIS  COMPLETEWITH MICROSCOPIC ([facility] ONLY)  CBG MONITORING, ED   ____________________________________________  EKG  ED ECG REPORT I, Gayla Doss, the attending physician, personally viewed and interpreted this ECG.   Date: 06/14/2015  EKG Time: 18:01  Rate: 80  Rhythm: normal sinus rhythm  Axis: normal  Intervals:RBBB, LAFB  ST&T Change: No acute ST elevation. LVH.  ____________________________________________  RADIOLOGY  CXR IMPRESSION: On the lateral view there is vague increased density posteriorly at 1 of the bases which probably represents atelectasis although less likely could represent a small focal infiltrate.   CT head IMPRESSION: 1. No acute intracranial abnormalities. 2. Atrophy and chronic microvascular ischemic change. ____________________________________________   PROCEDURES  Procedure(s) performed: None  Critical Care performed: No  ____________________________________________   INITIAL IMPRESSION / ASSESSMENT AND PLAN / ED COURSE  Pertinent labs & imaging results that were available during my care of the patient were reviewed by me and considered in my medical decision making (see chart for details).  [name] Townsend is a 82 y.o. female with history NICM/chronic systolic CHF with EF 45%, (cath 06/2013), aortic stenosis s/p TAVR 06/2013, HLD, and mood disorder who presents for evaluation of after falls which occurred yesterday and today. On exam, she is very well-appearing in no acute distress. Vital signs stable, she is afebrile. Her exam appears atraumatic and she has an intact neurological examination. Plan for screening labs, CT head, chest x-ray, urinalysis. Reassess for disposition.  ----------------------------------------- 8:38 PM on 06/14/2015 ----------------------------------------- Was reviewed. CBC and BMP unremarkable. Troponin elevated at 0.16 concerning for NSTEMI. The patient denies any chest pain or difficulty breathing at this  time.CT head shows no acute intercranial process.  Aspirin ordered. Case discussed with hospitalist, Dr. Anne Hahn, for admission. Chest x-ray with question of atelectasis versus, less likely to be a small focal infiltrate. The patient has had some nonproductive cough however no fevers, no leukocytosis so will not give antibiotics at this time. ____________________________________________   FINAL CLINICAL IMPRESSION(S) / ED DIAGNOSES  Final diagnoses:  Intermittent lightheadedness  Fall, initial encounter  NSTEMI (non-ST elevated myocardial infarction) (HCC)      Gayla Doss, MD 06/14/15 2042

## 2015-06-14 NOTE — ED Notes (Addendum)
States yesterday she had 2 episodes of dizziness and she fell, states she hit her head but denies any use of blood thinners, denies LOC, pt awake and alert upon arrival, pt lives at [facility] apartments

## 2015-06-15 ENCOUNTER — Observation Stay
Admit: 2015-06-15 | Discharge: 2015-06-15 | Disposition: A | Discharge status: Home / Self Care | Payer: Commercial Managed Care - HMO | Attending: Internal Medicine | Admitting: Internal Medicine

## 2015-06-15 ENCOUNTER — Observation Stay: Payer: Commercial Managed Care - HMO

## 2015-06-15 LAB — CBC
HEMATOCRIT: 36.5 % (ref 35.0–47.0)
Hemoglobin: 12.4 g/dL (ref 12.0–16.0)
MCH: 28.5 pg (ref 26.0–34.0)
MCHC: 33.9 g/dL (ref 32.0–36.0)
MCV: 84.1 fL (ref 80.0–100.0)
PLATELETS: 293 10*3/uL (ref 150–440)
RBC: 4.33 MIL/uL (ref 3.80–5.20)
RDW: 14.1 % (ref 11.5–14.5)
WBC: 7.6 10*3/uL (ref 3.6–11.0)

## 2015-06-15 LAB — BASIC METABOLIC PANEL
Anion gap: 8 (ref 5–15)
BUN: 18 mg/dL (ref 6–20)
CALCIUM: 8.7 mg/dL — AB (ref 8.9–10.3)
CO2: 29 mmol/L (ref 22–32)
CREATININE: 0.72 mg/dL (ref 0.44–1.00)
Chloride: 105 mmol/L (ref 101–111)
GFR calc Af Amer: >60 mL/min (ref >60)
GLUCOSE: 116 mg/dL — AB (ref 65–99)
POTASSIUM: 3.4 mmol/L — AB (ref 3.5–5.1)
SODIUM: 142 mmol/L (ref 135–145)

## 2015-06-15 LAB — TROPONIN I
TROPONIN I: 0.18 ng/mL — AB (ref <0.031)
TROPONIN I: 0.14 ng/mL — AB (ref <0.031)
Troponin I: 0.18 ng/mL — ABNORMAL HIGH (ref <0.031)

## 2015-06-15 LAB — MRSA PCR SCREENING: MRSA BY PCR: NEGATIVE

## 2015-06-15 MED ORDER — PANTOPRAZOLE SODIUM 40 MG PO TBEC
40.0000 mg | DELAYED_RELEASE_TABLET | Freq: Every day | ORAL | Status: DC
  Administered 2015-06-15 – 2015-06-16 (×2): 40 mg via ORAL
  Filled 2015-06-15 (×2): qty 1

## 2015-06-15 MED ORDER — LABETALOL HCL 5 MG/ML IV SOLN: 10.0000 mg | INTRAVENOUS | Status: DC | PRN

## 2015-06-15 MED ORDER — SODIUM CHLORIDE 0.9% FLUSH
3.0000 mL | Freq: Two times a day (BID) | INTRAVENOUS | Status: DC
  Administered 2015-06-15 – 2015-06-16 (×4): 3 mL via INTRAVENOUS

## 2015-06-15 MED ORDER — SIMVASTATIN 20 MG PO TABS
20.0000 mg | ORAL_TABLET | Freq: Every day | ORAL | Status: DC
  Administered 2015-06-15 (×2): 20 mg via ORAL
  Filled 2015-06-15 (×2): qty 1

## 2015-06-15 MED ORDER — ENOXAPARIN SODIUM 40 MG/0.4ML ~~LOC~~ SOLN
40.0000 mg | Freq: Every day | SUBCUTANEOUS | Status: DC
  Administered 2015-06-15 (×2): 40 mg via SUBCUTANEOUS
  Filled 2015-06-15 (×2): qty 0.4

## 2015-06-15 MED ORDER — ONDANSETRON HCL 4 MG PO TABS: 4.0000 mg | ORAL_TABLET | Freq: Four times a day (QID) | ORAL | Status: DC | PRN

## 2015-06-15 MED ORDER — ZOLPIDEM TARTRATE 5 MG PO TABS
5.0000 mg | ORAL_TABLET | Freq: Every evening | ORAL | Status: DC | PRN
  Administered 2015-06-15 (×2): 5 mg via ORAL
  Filled 2015-06-15 (×2): qty 1

## 2015-06-15 MED ORDER — CITALOPRAM HYDROBROMIDE 20 MG PO TABS
30.0000 mg | ORAL_TABLET | Freq: Every day | ORAL | Status: DC
  Administered 2015-06-15 – 2015-06-16 (×2): 30 mg via ORAL
  Filled 2015-06-15 (×2): qty 2

## 2015-06-15 MED ORDER — METOPROLOL TARTRATE 25 MG PO TABS
25.0000 mg | ORAL_TABLET | Freq: Two times a day (BID) | ORAL | Status: DC
  Administered 2015-06-15 – 2015-06-16 (×3): 25 mg via ORAL
  Filled 2015-06-15 (×3): qty 1

## 2015-06-15 MED ORDER — ACETAMINOPHEN 325 MG PO TABS: 650.0000 mg | ORAL_TABLET | Freq: Four times a day (QID) | ORAL | Status: DC | PRN

## 2015-06-15 MED ORDER — POTASSIUM CHLORIDE 20 MEQ PO PACK
20.0000 meq | PACK | Freq: Once | ORAL | Status: AC
  Administered 2015-06-15: 20 meq via ORAL
  Filled 2015-06-15: qty 1

## 2015-06-15 MED ORDER — ACETAMINOPHEN 650 MG RE SUPP: 650.0000 mg | Freq: Four times a day (QID) | RECTAL | Status: DC | PRN

## 2015-06-15 MED ORDER — ONDANSETRON HCL 4 MG/2ML IJ SOLN: 4.0000 mg | Freq: Four times a day (QID) | INTRAMUSCULAR | Status: DC | PRN

## 2015-06-15 MED ORDER — LISINOPRIL 5 MG PO TABS
2.5000 mg | ORAL_TABLET | Freq: Every day | ORAL | Status: DC
  Administered 2015-06-15: 2.5 mg via ORAL
  Filled 2015-06-15: qty 1

## 2015-06-15 MED ORDER — FERROUS SULFATE 325 (65 FE) MG PO TABS
325.0000 mg | ORAL_TABLET | Freq: Two times a day (BID) | ORAL | Status: DC
Start: 2015-06-15 — End: 2015-06-16
  Administered 2015-06-15 – 2015-06-16 (×3): 325 mg via ORAL
  Filled 2015-06-15 (×4): qty 1

## 2015-06-15 MED ORDER — MIDODRINE HCL 5 MG PO TABS
2.5000 mg | ORAL_TABLET | Freq: Three times a day (TID) | ORAL | Status: DC
  Administered 2015-06-15 – 2015-06-16 (×3): 2.5 mg via ORAL
  Filled 2015-06-15: qty 1
  Filled 2015-06-15: qty 0.5
  Filled 2015-06-15 (×3): qty 1

## 2015-06-15 NOTE — Progress Notes (Signed)
[facility] [place] at [facility]                                                                                                                                                                                            Patient Demographics   [name] Townsend, is a 82 y.o. female, DOB - 01/28/1920, VWU:[id number]  Admit date - 06/14/2015   Admitting Physician Oralia Manis, MD  Outpatient Primary MD for the patient is Marisue Ivan, MD   LOS -   Subjective: Patient admitted with unsteady gait as well as recurrent falls. Patient also reports feeling dizzy with standing.     Review of Systems:   CONSTITUTIONAL: No documented fever. No fatigue, weakness. No weight gain, no weight loss.  EYES: No blurry or double vision.  ENT: No tinnitus. No postnasal drip. No redness of the oropharynx.  RESPIRATORY: No cough, no wheeze, no hemoptysis. No dyspnea.  CARDIOVASCULAR: No chest pain. No orthopnea. No palpitations. Positive syncope GASTROINTESTINAL: No nausea, no vomiting or diarrhea. No abdominal pain. No melena or hematochezia.  GENITOURINARY: No dysuria or hematuria.  ENDOCRINE: No polyuria or nocturia. No heat or cold intolerance.  HEMATOLOGY: No anemia. No bruising. No bleeding.  INTEGUMENTARY: No rashes. No lesions.  MUSCULOSKELETAL: No arthritis. No swelling. No gout.  NEUROLOGIC: No numbness, tingling, or ataxia. No seizure-type activity. Gait unsteadiness PSYCHIATRIC: No anxiety. No insomnia. No ADD.    Vitals:   Filed Vitals:   06/14/15 2230 06/14/15 2346 06/15/15 0436 06/15/15 0604  BP: 146/84 153/70 180/78 157/73  Pulse: 79 79 82 83  Temp:  98.3 F (36.8 C) 98.1 F (36.7 C)   TempSrc:  Oral Oral   Resp: 23 18 16    Height:  5\' 4"  (1.626 m)    Weight:  69.083 kg (152 lb 4.8 oz)  69.083 kg (152 lb 4.8 oz)  SpO2: 95% 96% 94% 94%    Wt Readings from Last 3 Encounters:  06/15/15 69.083 kg (152 lb 4.8 oz)  09/30/14 69.854 kg (154 lb)      Intake/Output Summary (Last 24 hours) at 06/15/15 0959 Last data filed at 06/15/15 0436  Gross per 24 hour  Intake      0 ml  Output      0 ml  Net      0 ml    Physical Exam:   GENERAL: Pleasant-appearing in no apparent distress.  HEAD, EYES, EARS, NOSE AND THROAT: Atraumatic, normocephalic. Extraocular muscles are intact. Pupils equal and reactive to light. Sclerae anicteric. No conjunctival injection. No oro-pharyngeal erythema.  NECK: Supple. There is no jugular venous distention.  No bruits, no lymphadenopathy, no thyromegaly.  HEART: Regular rate and rhythm,. No murmurs, no rubs, no clicks.  LUNGS: Clear to auscultation bilaterally. No rales or rhonchi. No wheezes.  ABDOMEN: Soft, flat, nontender, nondistended. Has good bowel sounds. No hepatosplenomegaly appreciated.  EXTREMITIES: No evidence of any cyanosis, clubbing, or peripheral edema.  +2 pedal and radial pulses bilaterally.  NEUROLOGIC: The patient is alert, awake, and oriented x3 with no focal motor or sensory deficits appreciated bilaterally.  SKIN: Moist and warm with no rashes appreciated.  Psych: Not anxious, depressed LN: No inguinal LN enlargement    Antibiotics   Anti-infectives    None      Medications   Scheduled Meds: . citalopram  30 mg Oral Daily  . enoxaparin (LOVENOX) injection  40 mg Subcutaneous QHS  . ferrous sulfate  325 mg Oral BID WC  . lisinopril  2.5 mg Oral Daily  . metoprolol tartrate  25 mg Oral BID  . midodrine  2.5 mg Oral TID WC  . pantoprazole  40 mg Oral QAC breakfast  . simvastatin  20 mg Oral QHS  . sodium chloride flush  3 mL Intravenous Q12H   Continuous Infusions:  PRN Meds:.acetaminophen **OR** acetaminophen, labetalol, ondansetron **OR** ondansetron (ZOFRAN) IV, zolpidem   Data Review:   Micro Results Recent Results (from the past 240 hour(s))  MRSA PCR Screening     Status: None   Collection Time: 06/15/15 12:31 AM  Result Value Ref Range Status   MRSA by  PCR NEGATIVE NEGATIVE Final    Comment:        The GeneXpert MRSA Assay (FDA approved for NASAL specimens only), is one component of a comprehensive MRSA colonization surveillance program. It is not intended to diagnose MRSA infection nor to guide or monitor treatment for MRSA infections.     Radiology Reports Dg Chest 2 View  06/14/2015  CLINICAL DATA:  Cough.  Patient fell yesterday due to dizziness. EXAM: CHEST  2 VIEW COMPARISON:  05/30/2014 FINDINGS: Evidence of previous percutaneous aortic valve replacement. Multiple surgical clips in the region of the thyroid. Heart size and pulmonary vascularity are normal. Extensive calcification in the thoracic aorta. Nodular density at the superior aspect of the right hilum is most likely a granuloma and appears unchanged in size since the prior study, 15 mm. Slight increased density posteriorly on the lateral view could represent focal atelectasis or patchy infiltrate. No acute osseous abnormality. IMPRESSION: On the lateral view there is vague increased density posteriorly at 1 of the bases which probably represents atelectasis although less likely could represent a small focal infiltrate. Electronically Signed   By: Francene Boyers M.D.   On: 06/14/2015 19:41   Ct Head Wo Contrast  06/14/2015  CLINICAL DATA:  States yesterday she had 2 episodes of dizziness and she fell today, states she hit her head but denies any use of blood thinners, denies LOC, pt awake and alert upon arrival. EXAM: CT HEAD WITHOUT CONTRAST TECHNIQUE: Contiguous axial images were obtained from the base of the skull through the vertex without intravenous contrast. COMPARISON:  None. FINDINGS: The ventricles are normal in configuration. There is ventricular and sulcal enlargement reflecting moderate generalized atrophy. No hydrocephalus. There are no parenchymal masses or mass effect. There is no evidence of a cortical infarct. White matter hypoattenuation is noted consistent with  advanced chronic microvascular ischemic change. There are no extra-axial masses or abnormal fluid collections. There is no intracranial hemorrhage. Visualized sinuses and mastoid air cells are  clear. No skull fracture. IMPRESSION: 1. No acute intracranial abnormalities. 2. Atrophy and chronic microvascular ischemic change. Electronically Signed   By: Amie Portland M.D.   On: 06/14/2015 18:31     CBC  Recent Labs Lab 06/14/15 1758 06/15/15 0607  WBC 9.3 7.6  HGB 13.2 12.4  HCT 40.0 36.5  PLT 324 293  MCV 84.9 84.1  MCH 28.1 28.5  MCHC 33.1 33.9  RDW 14.2 14.1    Chemistries   Recent Labs Lab 06/14/15 1758 06/15/15 0607  NA 139 142  K 3.7 3.4*  CL 102 105  CO2 27 29  GLUCOSE 119* 116*  BUN 15 18  CREATININE 0.55 0.72  CALCIUM 9.2 8.7*   ------------------------------------------------------------------------------------------------------------------ estimated creatinine clearance is 42 mL/min (by C-G formula based on Cr of 0.72). ------------------------------------------------------------------------------------------------------------------ No results for input(s): HGBA1C in the last 72 hours. ------------------------------------------------------------------------------------------------------------------ No results for input(s): CHOL, HDL, LDLCALC, TRIG, CHOLHDL, LDLDIRECT in the last 72 hours. ------------------------------------------------------------------------------------------------------------------ No results for input(s): TSH, T4TOTAL, T3FREE, THYROIDAB in the last 72 hours.  Invalid input(s): FREET3 ------------------------------------------------------------------------------------------------------------------ No results for input(s): VITAMINB12, FOLATE, FERRITIN, TIBC, IRON, RETICCTPCT in the last 72 hours.  Coagulation profile No results for input(s): INR, PROTIME in the last 168 hours.  No results for input(s): DDIMER in the last 72  hours.  Cardiac Enzymes  Recent Labs Lab 06/14/15 1718 06/15/15 0047 06/15/15 0607  TROPONINI 0.16* 0.18* 0.18*   ------------------------------------------------------------------------------------------------------------------ Invalid input(s): POCBNP    Assessment & Plan  Patient is a 82 year old admitted with falls and unsteady gait   1. Elevated troponin -  felt to be due to demand ischemia seen by cardiology echocardiogram is been ordered. Start aspirin 2.  Multiple falls - I had the nurse check her orthostatics. Which she was positive for orthostatic hypotension. I will start her on midodrine. 3.  Accelerated hypertension -  Blood pressure improved likely has autonomic dysfunction  4. Gait instability will obtain a physical therapy evaluation, MRI of the brain  5.  Chronic systolic CHF (congestive heart failure) (HCC) - continue home meds for this, evaluate with echocardiogram in the morning. Does not seem to be in exacerbation. 6. HLD (hyperlipidemia) - continue the home meds 7.  Depression - Continue home meds 8.  GERD (gastroesophageal reflux disease) - home dose PPI      Code Status Orders        Start     Ordered   06/15/15 0013  Full code   Continuous     06/15/15 0012    Code Status History    Date Active Date Inactive Code Status Order ID Comments User Context   This patient has a current code status but no historical code status.    Advance Directive Documentation        Most Recent Value   Type of Advance Directive  Healthcare Power of Attorney, Living will   Pre-existing out of facility DNR order (yellow form or pink MOST form)     "MOST" Form in Place?             Consults  cardiology  DVT Prophylaxis  loevnox  Lab Results  Component Value Date   PLT 293 06/15/2015     Time Spent in minutes    Greater than 50% of time spent in care coordination and counseling patient regarding the condition and plan of care. Discussed  with the patient and her family she understands her condition and plan of care.   Senta Kantor, [facility]  M.D on 06/15/2015 at 9:59 AM  Between 7am to 6pm - Pager - [phone]  After 6pm go to [email or web address] - password [email or web address] [facility]  [facility]   Office  [phone]

## 2015-06-15 NOTE — Care Management (Signed)
Patient placed in observation. Patient is a resident of [facility].  She has received outpatient therapy through the onsite [facility]. She has an aide that comes in daily   Patient has had several falls over the past 24 hours prior to presentation.  Her systolic blood pressure significantly elevated on arrival- >200.  She was complaining of some dizziness.  She has a mildly elevated troponin.  Cardiology and physical therapy  consult pending.  Discussed home health follow up with daughter Okey Regal .  [facility] follows patient's in this retirement community and discussed the possibility of home health nurse and physical therapy with daughters.

## 2015-06-15 NOTE — Progress Notes (Signed)
Dr. Allena Katz notified of positive orthostatics and MRI results.

## 2015-06-15 NOTE — Consult Note (Signed)
Reason for Consult: Elevated troponin , history of congestive heart failure Referring Physician: Dr. Morton Amy primary, Dr. Lance Coon hospitalist  [name] Townsend is an 82 y.o. female.  HPI: Patient reportedly has a history of aortic valve replacement in the past with a bioprosthetic valveTAVR patient complains of mild vertigo or lightheadedness she fell at home she's had multiple falls recently mostly because of poor balance so was brought to the emergency room for evaluation. Have borderline troponins a cardiology was recommended. Patient denies any significant palpitations or tachycardia no significant chest pain has a history of a nonischemic cardiomyopathy with systolic dysfunction. Baseline abnormal EKG with left anterior fascicular block. Patient's had trouble with elevated blood pressure especially prior to admission but is somewhat better now. Patient states she fell but denied any syncope or loss of consciousness. Patient feels reasonably well now daughter is in the room with her at this point.      Past Medical History  Diagnosis Date  . Aortic valve replaced   . Hypertension   . HLD (hyperlipidemia)   . GERD (gastroesophageal reflux disease)   . Depression   . Chronic systolic CHF (congestive heart failure) [facility])     Past Surgical History  Procedure Laterality Date  . Abdominal hysterectomy    . Parathyroid exploration      tumor removal  . Breast lumpectomy      benign lesion  . Aortic valve replacement      TAVR    Family History  Problem Relation Age of Onset  . Leukemia Father   . Cancer Sister   . Heart attack Sister     Social History:  reports that she has never smoked. She does not have any smokeless tobacco history on file. She reports that she does not drink alcohol or use illicit drugs.  Allergies:  Allergies  Allergen Reactions  . Codeine Nausea And Vomiting    Medications: I have reviewed the patient's current medications.  Results for orders  placed or performed during the hospital encounter of 06/14/15 (from the past 48 hour(s))  Troponin I     Status: Abnormal   Collection Time: 06/14/15  5:18 PM  Result Value Ref Range   Troponin I 0.16 (H) <0.031 ng/mL    Comment: READ BACK AND VERIFIED WITH VANESSA ASHLEY ON 06/14/15 AT 2009 BY TLB        PERSISTENTLY INCREASED TROPONIN VALUES IN THE RANGE OF 0.04-0.49 ng/mL CAN BE SEEN IN:       -UNSTABLE ANGINA       -CONGESTIVE HEART FAILURE       -MYOCARDITIS       -CHEST TRAUMA       -ARRYHTHMIAS       -LATE PRESENTING MYOCARDIAL INFARCTION       -COPD   CLINICAL FOLLOW-UP RECOMMENDED.   Basic metabolic panel     Status: Abnormal   Collection Time: 06/14/15  5:58 PM  Result Value Ref Range   Sodium 139 135 - 145 mmol/L   Potassium 3.7 3.5 - 5.1 mmol/L   Chloride 102 101 - 111 mmol/L   CO2 27 22 - 32 mmol/L   Glucose, Bld 119 (H) 65 - 99 mg/dL   BUN 15 6 - 20 mg/dL   Creatinine, Ser 0.55 0.44 - 1.00 mg/dL   Calcium 9.2 8.9 - 10.3 mg/dL   GFR calc non Af Amer >60 >60 mL/min   GFR calc Af Amer >60 >60 mL/min    Comment: (NOTE) The  eGFR has been calculated using the CKD EPI equation. This calculation has not been validated in all clinical situations. eGFR's persistently <60 mL/min signify possible Chronic Kidney Disease.    Anion gap 10 5 - 15  CBC     Status: None   Collection Time: 06/14/15  5:58 PM  Result Value Ref Range   WBC 9.3 3.6 - 11.0 K/uL   RBC 4.71 3.80 - 5.20 MIL/uL   Hemoglobin 13.2 12.0 - 16.0 g/dL   HCT 40.0 35.0 - 47.0 %   MCV 84.9 80.0 - 100.0 fL   MCH 28.1 26.0 - 34.0 pg   MCHC 33.1 32.0 - 36.0 g/dL   RDW 14.2 11.5 - 14.5 %   Platelets 324 150 - 440 K/uL  Urinalysis complete, with microscopic ([facility] only)     Status: Abnormal   Collection Time: 06/14/15  5:58 PM  Result Value Ref Range   Color, Urine YELLOW (A) YELLOW   APPearance HAZY (A) CLEAR   Glucose, UA NEGATIVE NEGATIVE mg/dL   Bilirubin Urine NEGATIVE NEGATIVE   Ketones, ur  NEGATIVE NEGATIVE mg/dL   Specific Gravity, Urine 1.010 1.005 - 1.030   Hgb urine dipstick 2+ (A) NEGATIVE   pH 6.0 5.0 - 8.0   Protein, ur 100 (A) NEGATIVE mg/dL   Nitrite NEGATIVE NEGATIVE   Leukocytes, UA TRACE (A) NEGATIVE   RBC / HPF 6-30 0 - 5 RBC/hpf   WBC, UA 0-5 0 - 5 WBC/hpf   Bacteria, UA RARE (A) NONE SEEN   Squamous Epithelial / LPF 0-5 (A) NONE SEEN   Mucous PRESENT   MRSA PCR Screening     Status: None   Collection Time: 06/15/15 12:31 AM  Result Value Ref Range   MRSA by PCR NEGATIVE NEGATIVE    Comment:        The GeneXpert MRSA Assay (FDA approved for NASAL specimens only), is one component of a comprehensive MRSA colonization surveillance program. It is not intended to diagnose MRSA infection nor to guide or monitor treatment for MRSA infections.   Troponin I     Status: Abnormal   Collection Time: 06/15/15 12:47 AM  Result Value Ref Range   Troponin I 0.18 (H) <0.031 ng/mL    Comment: PREVIOUS RESULT CALLED VANESSA  ASHLEY AT 2009 ON 06/14/15 BY TLB. VAB        PERSISTENTLY INCREASED TROPONIN VALUES IN THE RANGE OF 0.04-0.49 ng/mL CAN BE SEEN IN:       -UNSTABLE ANGINA       -CONGESTIVE HEART FAILURE       -MYOCARDITIS       -CHEST TRAUMA       -ARRYHTHMIAS       -LATE PRESENTING MYOCARDIAL INFARCTION       -COPD   CLINICAL FOLLOW-UP RECOMMENDED.   Troponin I     Status: Abnormal   Collection Time: 06/15/15  6:07 AM  Result Value Ref Range   Troponin I 0.18 (H) <0.031 ng/mL    Comment: PREVIOUS RESULT CALLED TO VANESSA ASHLEY AT 2009 ON 06/14/15 BY TLB/VAB        PERSISTENTLY INCREASED TROPONIN VALUES IN THE RANGE OF 0.04-0.49 ng/mL CAN BE SEEN IN:       -UNSTABLE ANGINA       -CONGESTIVE HEART FAILURE       -MYOCARDITIS       -CHEST TRAUMA       -ARRYHTHMIAS       -LATE PRESENTING MYOCARDIAL INFARCTION       -  COPD   CLINICAL FOLLOW-UP RECOMMENDED.   Basic metabolic panel     Status: Abnormal   Collection Time: 06/15/15  6:07 AM   Result Value Ref Range   Sodium 142 135 - 145 mmol/L   Potassium 3.4 (L) 3.5 - 5.1 mmol/L   Chloride 105 101 - 111 mmol/L   CO2 29 22 - 32 mmol/L   Glucose, Bld 116 (H) 65 - 99 mg/dL   BUN 18 6 - 20 mg/dL   Creatinine, Ser 0.72 0.44 - 1.00 mg/dL   Calcium 8.7 (L) 8.9 - 10.3 mg/dL   GFR calc non Af Amer >60 >60 mL/min   GFR calc Af Amer >60 >60 mL/min    Comment: (NOTE) The eGFR has been calculated using the CKD EPI equation. This calculation has not been validated in all clinical situations. eGFR's persistently <60 mL/min signify possible Chronic Kidney Disease.    Anion gap 8 5 - 15  CBC     Status: None   Collection Time: 06/15/15  6:07 AM  Result Value Ref Range   WBC 7.6 3.6 - 11.0 K/uL   RBC 4.33 3.80 - 5.20 MIL/uL   Hemoglobin 12.4 12.0 - 16.0 g/dL   HCT 36.5 35.0 - 47.0 %   MCV 84.1 80.0 - 100.0 fL   MCH 28.5 26.0 - 34.0 pg   MCHC 33.9 32.0 - 36.0 g/dL   RDW 14.1 11.5 - 14.5 %   Platelets 293 150 - 440 K/uL  Troponin I     Status: Abnormal   Collection Time: 06/15/15 12:33 PM  Result Value Ref Range   Troponin I 0.14 (H) <0.031 ng/mL    Comment: PREVIOUS RESULT CALLED TO VANESSA ASHLEY AT 2009 ON 06/14/15 BY TLB...[place]        PERSISTENTLY INCREASED TROPONIN VALUES IN THE RANGE OF 0.04-0.49 ng/mL CAN BE SEEN IN:       -UNSTABLE ANGINA       -CONGESTIVE HEART FAILURE       -MYOCARDITIS       -CHEST TRAUMA       -ARRYHTHMIAS       -LATE PRESENTING MYOCARDIAL INFARCTION       -COPD   CLINICAL FOLLOW-UP RECOMMENDED.     Dg Chest 2 View  06/14/2015  CLINICAL DATA:  Cough.  Patient fell yesterday due to dizziness. EXAM: CHEST  2 VIEW COMPARISON:  05/30/2014 FINDINGS: Evidence of previous percutaneous aortic valve replacement. Multiple surgical clips in the region of the thyroid. Heart size and pulmonary vascularity are normal. Extensive calcification in the thoracic aorta. Nodular density at the superior aspect of the right hilum is most likely a granuloma and  appears unchanged in size since the prior study, 15 mm. Slight increased density posteriorly on the lateral view could represent focal atelectasis or patchy infiltrate. No acute osseous abnormality. IMPRESSION: On the lateral view there is vague increased density posteriorly at 1 of the bases which probably represents atelectasis although less likely could represent a small focal infiltrate. Electronically Signed   By: Lorriane Shire M.D.   On: 06/14/2015 19:41   Ct Head Wo Contrast  06/14/2015  CLINICAL DATA:  States yesterday she had 2 episodes of dizziness and she fell today, states she hit her head but denies any use of blood thinners, denies LOC, pt awake and alert upon arrival. EXAM: CT HEAD WITHOUT CONTRAST TECHNIQUE: Contiguous axial images were obtained from the base of the skull through the vertex without intravenous contrast. COMPARISON:  None. FINDINGS: The ventricles are normal in configuration. There is ventricular and sulcal enlargement reflecting moderate generalized atrophy. No hydrocephalus. There are no parenchymal masses or mass effect. There is no evidence of a cortical infarct. White matter hypoattenuation is noted consistent with advanced chronic microvascular ischemic change. There are no extra-axial masses or abnormal fluid collections. There is no intracranial hemorrhage. Visualized sinuses and mastoid air cells are clear. No skull fracture. IMPRESSION: 1. No acute intracranial abnormalities. 2. Atrophy and chronic microvascular ischemic change. Electronically Signed   By: Lajean Manes M.D.   On: 06/14/2015 18:31   Mr Brain Wo Contrast  06/15/2015  CLINICAL DATA:  CVA with unsteady gait EXAM: MRI HEAD WITHOUT CONTRAST TECHNIQUE: Multiplanar, multiecho pulse sequences of the brain and surrounding structures were obtained without intravenous contrast. COMPARISON:  06/14/2015 head CT FINDINGS: Calvarium and upper cervical spine: No focal marrow signal abnormality. Questionable mild left  parietal scalp swelling. Orbits: Left cataract resection.  No pathologic finding. Sinuses and Mastoids: Mucosal thickening in bilateral paranasal sinuses, greatest in the maxillary antra with left-sided fluid level. Right more than left mastoid fluid and mucosal thickening with clear nasopharynx. Brain: No acute abnormality such as acute infarct, hemorrhage, hydrocephalus, or mass lesion. No evidence of large vessel occlusion. There is confluent white matter disease without mass effect in the bilateral cerebral hemispheres consistent with advanced chronic small vessel ischemia. These changes are also patchy throughout the pons and deep gray nuclei. Dilated perivascular spaces in the basal ganglia, often seen with chronic hypertension at this age. Generalized atrophy with ventriculomegaly. 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 IMPRESSION: 1. No acute finding, including infarct. 2. Severe chronic small vessel disease. 3. Chronic sinusitis and mastoiditis with superimposed left maxillary fluid level. Electronically Signed   By: Monte Fantasia M.D.   On: 06/15/2015 12:00    Review of Systems  Constitutional: Positive for malaise/fatigue.  HENT: Negative.   Eyes: Negative.   Respiratory: Negative.   Cardiovascular: Negative.   Gastrointestinal: Negative.   Genitourinary: Negative.   Musculoskeletal: Positive for myalgias and falls.  Skin: Negative.   Neurological: Positive for focal weakness and weakness.  Endo/Heme/Allergies: Negative.   Psychiatric/Behavioral: Positive for memory loss.   Blood pressure 159/97, pulse 79, temperature 97.8 F (36.6 C), temperature source Oral, resp. rate 20, height 5' 4"  (1.626 m), weight 69.083 kg (152 lb 4.8 oz), SpO2 92 %. Physical Exam  Nursing note and vitals reviewed. Constitutional: She is oriented to person, place, and time. She appears well-developed and well-nourished.  HENT:  Head: Normocephalic and atraumatic.  Eyes:  Conjunctivae and EOM are normal. Pupils are equal, round, and reactive to light.  Neck: Normal range of motion. Neck supple.  Cardiovascular: Normal rate and regular rhythm.   Murmur heard. Respiratory: Effort normal and breath sounds normal.  GI: Soft. Bowel sounds are normal.  Musculoskeletal: Normal range of motion.  Neurological: She is alert and oriented to person, place, and time. She has normal reflexes.  Skin: Skin is warm and dry.  Psychiatric: She has a normal mood and affect.    Assessment/Plan: Elevated troponin probably demand ischemia Recent fall Hyperlipidemia Congestive heart failure systolic GERD Hypertension Depression Vertigo Aortic valve disease . PLAN Agree with admission rule out for myocardial infarction Follow-up troponins and EKGs Physical therapy to help with gait training and falls Hypertension control with labetalol lisinopril metoprolol Continue beta-blockade therapy and ACE inhibitor for compensated systolic heart failure DVT prophylaxis with Lovenox Continue simvastatin therapy for  hyperlipidemia Agree with Protonix for GERD symptoms Conservative medical therapy from a   CALLWOOD,DWAYNE D. 06/15/2015, 4:43 PM

## 2015-06-15 NOTE — Progress Notes (Signed)
PT Cancellation Note  Patient Details Name: Jossalin Chervenak MRN: [id number] DOB: [birth year]   Cancelled Treatment:    Reason Eval/Treat Not Completed: Medical issues which prohibited therapy. Pt is not appropriate to participate in therapy at this time due to elevated troponin of 0.14 and pending cardiology consult and echocardiogram. Pt will f/u tomorrow and complete evaluation when pt is medically appropriate.   Adelene Idler, PT, DPT  06/15/2015, 2:54 PM [phone]

## 2015-06-15 NOTE — Care Management Obs Status (Signed)
MEDICARE OBSERVATION STATUS NOTIFICATION   Patient Details  Name: [name] Townsend MRN: [id number] Date of Birth: 31-Oct-1919   Medicare Observation Status Notification Given:  Yes   Declined to sign the notice    Eber Hong, RN 06/15/2015, 1:03 PM

## 2015-06-15 NOTE — Progress Notes (Signed)
Pharmacist - Prescriber Communication  Per [facility] Policy, the order for diphenhydramine 25 mg po at bedtime PRN sleep has been changed to zolpidem 5 mg for patient over 80 years old. Please call pharmacy if you have any questions about this substitution.  Carola Frost, Pharm.D., BCPS Clinical Pharmacist 06/15/2015 [phone]

## 2015-06-15 NOTE — Progress Notes (Signed)
*  PRELIMINARY RESULTS* Echocardiogram 2D Echocardiogram has been performed.  [name] Townsend 06/15/2015, 2:27 PM

## 2015-06-16 MED ORDER — LISINOPRIL 5 MG PO TABS: 5.0000 mg | ORAL_TABLET | Freq: Every day | ORAL | Status: AC

## 2015-06-16 MED ORDER — MIDODRINE HCL 2.5 MG PO TABS: 2.5000 mg | ORAL_TABLET | Freq: Two times a day (BID) | ORAL | Status: AC

## 2015-06-16 MED ORDER — LISINOPRIL 5 MG PO TABS
5.0000 mg | ORAL_TABLET | Freq: Every day | ORAL | Status: DC
  Administered 2015-06-16: 5 mg via ORAL
  Filled 2015-06-16: qty 1

## 2015-06-16 NOTE — Progress Notes (Addendum)
Patient discharged to home, tele monitor turned in, IV site DC, bleeding controlled, pt will have home health and PT, scrips sent to [facility], daughter verbalized understanding, left hospital in car with her daughter

## 2015-06-16 NOTE — Discharge Summary (Signed)
[name] Townsend, 82 y.o., DOB April 13, 1920, MRN [id number]. Admission date: 06/14/2015 Discharge Date 06/16/2015 Primary MD Marisue Ivan, MD Admitting Physician Oralia Manis, MD  Admission Diagnosis  NSTEMI (non-ST elevated myocardial infarction) [facility]) [I21.4] Intermittent lightheadedness [R42] Fall, initial encounter [W19.XXXA]  Discharge Diagnosis   Principal Problem: Orthostatic hypotension  Elevated troponin   Multiple falls   HLD (hyperlipidemia)   Chronic systolic CHF (congestive heart failure) (HCC)   Depression   GERD (gastroesophageal reflux disease)   Accelerated hypertension         Hospital Course [name] Townsend is a 82 y.o. female who presents with 2 falls over the last 2 days. Patient states that both times she fell in her bathroom. She denies any loss of consciousness with either of these falls. She states that one of the time she fell it was just after urinating. She describes the episode as having happened after she stood up, she became "foggy headed", and then fell backwards. She denies any chest pain, shortness of breath, diaphoresis, nausea and vomiting, headache, blurred vision. After second fall she decided to come in to the ED for evaluation. Patient was seen in the ER and admitted. Further evaluation was done which showed no CVA. Orthostatics were positive. Patient was started on Midrin Dean. However her resting blood pressure is elevated. Her blood pressure medications have been adjusted. She had a echocardiogram ordered which is currently not read this will need to be followed up when she is followed by her primary care provider. Much better with her gait at discharge. Home physical therapy has been arranged.            Consults  cardiology  Significant Tests:  See full reports for all details      Dg Chest 2 View  06/14/2015  CLINICAL DATA:  Cough.  Patient fell yesterday due to dizziness. EXAM: CHEST  2 VIEW COMPARISON:  05/30/2014 FINDINGS:  Evidence of previous percutaneous aortic valve replacement. Multiple surgical clips in the region of the thyroid. Heart size and pulmonary vascularity are normal. Extensive calcification in the thoracic aorta. Nodular density at the superior aspect of the right hilum is most likely a granuloma and appears unchanged in size since the prior study, 15 mm. Slight increased density posteriorly on the lateral view could represent focal atelectasis or patchy infiltrate. No acute osseous abnormality. IMPRESSION: On the lateral view there is vague increased density posteriorly at 1 of the bases which probably represents atelectasis although less likely could represent a small focal infiltrate. Electronically Signed   By: Francene Boyers M.D.   On: 06/14/2015 19:41   Ct Head Wo Contrast  06/14/2015  CLINICAL DATA:  States yesterday she had 2 episodes of dizziness and she fell today, states she hit her head but denies any use of blood thinners, denies LOC, pt awake and alert upon arrival. EXAM: CT HEAD WITHOUT CONTRAST TECHNIQUE: Contiguous axial images were obtained from the base of the skull through the vertex without intravenous contrast. COMPARISON:  None. FINDINGS: The ventricles are normal in configuration. There is ventricular and sulcal enlargement reflecting moderate generalized atrophy. No hydrocephalus. There are no parenchymal masses or mass effect. There is no evidence of a cortical infarct. White matter hypoattenuation is noted consistent with advanced chronic microvascular ischemic change. There are no extra-axial masses or abnormal fluid collections. There is no intracranial hemorrhage. Visualized sinuses and mastoid air cells are clear. No skull fracture. IMPRESSION: 1. No acute intracranial abnormalities. 2. Atrophy and chronic microvascular ischemic change.  Electronically Signed   By: Amie Portland M.D.   On: 06/14/2015 18:31   Mr Brain Wo Contrast  06/15/2015  CLINICAL DATA:  CVA with unsteady gait  EXAM: MRI HEAD WITHOUT CONTRAST TECHNIQUE: Multiplanar, multiecho pulse sequences of the brain and surrounding structures were obtained without intravenous contrast. COMPARISON:  06/14/2015 head CT FINDINGS: Calvarium and upper cervical spine: No focal marrow signal abnormality. Questionable mild left parietal scalp swelling. Orbits: Left cataract resection.  No pathologic finding. Sinuses and Mastoids: Mucosal thickening in bilateral paranasal sinuses, greatest in the maxillary antra with left-sided fluid level. Right more than left mastoid fluid and mucosal thickening with clear nasopharynx. Brain: No acute abnormality such as acute infarct, hemorrhage, hydrocephalus, or mass lesion. No evidence of large vessel occlusion. There is confluent white matter disease without mass effect in the bilateral cerebral hemispheres consistent with advanced chronic small vessel ischemia. These changes are also patchy throughout the pons and deep gray nuclei. Dilated perivascular spaces in the basal ganglia, often seen with chronic hypertension at this age. Generalized atrophy with ventriculomegaly. 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 IMPRESSION: 1. No acute finding, including infarct. 2. Severe chronic small vessel disease. 3. Chronic sinusitis and mastoiditis with superimposed left maxillary fluid level. Electronically Signed   By: Marnee Spring M.D.   On: 06/15/2015 12:00       Today   Subjective:   [name] Townsend  Feels much better no complaints  Objective:   Blood pressure 184/89, pulse 86, temperature 98.2 F (36.8 C), temperature source Oral, resp. rate 18, height  (1.626 m), weight 68.901 kg (151 lb 14.4 oz), SpO2 92 %.  .  Intake/Output Summary (Last 24 hours) at 06/16/15 1724 Last data filed at 06/16/15 0733  Gross per 24 hour  Intake    480 ml  Output      0 ml  Net    480 ml    Exam VITAL SIGNS: Blood pressure 184/89, pulse 86, temperature 98.2 F (36.8  C), temperature source Oral, resp. rate 18, height  (1.626 m), weight 68.901 kg (151 lb 14.4 oz), SpO2 92 %.  GENERAL:  82 y.o.-year-old patient lying in the bed with no acute distress.  EYES: Pupils equal, round, reactive to light and accommodation. No scleral icterus. Extraocular muscles intact.  HEENT: Head atraumatic, normocephalic. Oropharynx and nasopharynx clear.  NECK:  Supple, no jugular venous distention. No thyroid enlargement, no tenderness.  LUNGS: Normal breath sounds bilaterally, no wheezing, rales,rhonchi or crepitation. No use of accessory muscles of respiration.  CARDIOVASCULAR: S1, S2 normal. No murmurs, rubs, or gallops.  ABDOMEN: Soft, nontender, nondistended. Bowel sounds present. No organomegaly or mass.  EXTREMITIES: No pedal edema, cyanosis, or clubbing.  NEUROLOGIC: Cranial nerves II through XII are intact. Muscle strength 5/5 in all extremities. Sensation intact. Gait not checked.  PSYCHIATRIC: The patient is alert and oriented x 3.  SKIN: No obvious rash, lesion, or ulcer.   Data Review     CBC w Diff: Lab Results  Component Value Date   WBC 7.6 06/15/2015   HGB 12.4 06/15/2015   HCT 36.5 06/15/2015   PLT 293 06/15/2015   CMP: Lab Results  Component Value Date   NA 142 06/15/2015   K 3.4* 06/15/2015   CL 105 06/15/2015   CO2 29 06/15/2015   BUN 18 06/15/2015   CREATININE 0.72 06/15/2015   PROT 6.9 09/30/2014   ALBUMIN 3.2* 09/30/2014   BILITOT 0.2* 09/30/2014   ALKPHOS  67 09/30/2014   AST 20 09/30/2014   ALT 12* 09/30/2014  .  Micro Results Recent Results (from the past 240 hour(s))  MRSA PCR Screening     Status: None   Collection Time: 06/15/15 12:31 AM  Result Value Ref Range Status   MRSA by PCR NEGATIVE NEGATIVE Final    Comment:        The GeneXpert MRSA Assay (FDA approved for NASAL specimens only), is one component of a comprehensive MRSA colonization surveillance program. It is not intended to diagnose MRSA infection nor  to guide or monitor treatment for MRSA infections.      Code Status History    Date Active Date Inactive Code Status Order ID Comments User Context   06/15/2015 12:13 AM 06/16/2015  3:28 PM Full Code [id number]  Oralia Manis, MD Inpatient    Advance Directive Documentation        Most Recent Value   Type of Advance Directive  Healthcare Power of Attorney, Living will   Pre-existing out of facility DNR order (yellow form or pink MOST form)     "MOST" Form in Place?            Follow-up Information    Follow up with Marisue Ivan, MD In 7 days.   Specialty:  Family Medicine   Why:  MARCH 9 THURSDAY AT 1:30 PM.TD   Contact information:   [address] [facility] [place] [address] [phone]       Discharge Medications     Medication List    TAKE these medications        citalopram 20 MG tablet  Commonly known as:  CELEXA  Take 30 mg by mouth daily.     doxylamine (Sleep) 25 MG tablet  Commonly known as:  UNISOM  Take 25 mg by mouth at bedtime as needed for sleep.     ferrous sulfate 325 (65 FE) MG tablet  Take 325 mg by mouth 2 (two) times daily with a meal.     folic acid 800 MCG tablet  Commonly known as:  FOLVITE  Take 800 mcg by mouth daily.     furosemide 20 MG tablet  Commonly known as:  LASIX  Take 20 mg by mouth daily as needed for edema.     lisinopril 5 MG tablet  Commonly known as:  PRINIVIL,ZESTRIL  Take 1 tablet (5 mg total) by mouth daily.     metoprolol tartrate 25 MG tablet  Commonly known as:  LOPRESSOR  Take 25 mg by mouth 2 (two) times daily.     midodrine 2.5 MG tablet  Commonly known as:  PROAMATINE  Take 1 tablet (2.5 mg total) by mouth 2 (two) times daily with a meal.     omeprazole 20 MG capsule  Commonly known as:  PRILOSEC  Take 20 mg by mouth daily.     PRESERVISION AREDS PO  Take 2 capsules by mouth daily.     simvastatin 20 MG tablet  Commonly known as:  ZOCOR  Take 20 mg by mouth at  bedtime.     vitamin B-12 500 MCG tablet  Commonly known as:  CYANOCOBALAMIN  Take 500 mcg by mouth daily.           Total Time in preparing paper work, data evaluation and todays exam - 35 minutes  Auburn Bilberry M.D on 06/16/2015 at 5:24 PM  [facility]   Office  [phone]

## 2015-06-16 NOTE — Care Management Note (Signed)
Case Management Note  Patient Details  Name: [name] Townsend MRN: [id number] Date of Birth: 1919/06/04  Subjective/Objective:   Met with patient and her daughter [name] Townsend at bedside. Discussed home health again and they are both agreeable to [facility] with [facility] for SN, PT and HHA. Notified cheryl with [facility]. Faxed all discharge information. Patient has all needed DME, walker, BSC and cane. Family is going to provide patient with around the clock care for a short period until she is stronger.                 Action/Plan: Home Health SN, PT and HHA.   Expected Discharge Date:                  Expected Discharge Plan:  [facility]  In-House Referral:     Discharge planning Services  CM Consult  Post Acute Care Choice:  Home Health Choice offered to:  Patient, Adult Children  DME Arranged:    DME Agency:     HH Arranged:  RN, PT, Nurse's Aide HH Agency:  [facility]  Status of Service:  Completed, signed off  Medicare Important Message Given:    Date Medicare IM Given:    Medicare IM give by:    Date Additional Medicare IM Given:    Additional Medicare Important Message give by:     If discussed at [place] of Stay Meetings, dates discussed:    Additional Comments:  Jolly Mango, RN 06/16/2015, 10:05 AM

## 2015-06-16 NOTE — Discharge Instructions (Signed)
°  DIET:  °Cardiac diet ° °DISCHARGE CONDITION:  °Stable ° °ACTIVITY:  °Activity as tolerated with walker ° °OXYGEN:  °Home Oxygen: No. °  °Oxygen Delivery: room air ° °DISCHARGE LOCATION:  °home  ° ° °ADDITIONAL DISCHARGE INSTRUCTION: ° ° °If you experience worsening of your admission symptoms, develop shortness of breath, life threatening emergency, suicidal or homicidal thoughts you must seek medical attention immediately by calling 911 or calling your MD immediately  if symptoms less severe. ° °You Must read complete instructions/literature along with all the possible adverse reactions/side effects for all the Medicines you take and that have been prescribed to you. Take any new Medicines after you have completely understood and accpet all the possible adverse reactions/side effects.  ° °Please note ° °You were cared for by a hospitalist during your hospital stay. If you have any questions about your discharge medications or the care you received while you were in the hospital after you are discharged, you can call the unit and asked to speak with the hospitalist on call if the hospitalist that took care of you is not available. Once you are discharged, your primary care physician will handle any further medical issues. Please note that NO REFILLS for any discharge medications will be authorized once you are discharged, as it is imperative that you return to your primary care physician (or establish a relationship with a primary care physician if you do not have one) for your aftercare needs so that they can reassess your need for medications and monitor your lab values. ° ° °

## 2015-09-16 DEATH — deceased

## 2016-11-19 IMAGING — MR MR HEAD W/O CM
10 series · 41 of 48 positions shown · non-contrast
Comparison: 06/14/2015 head CT

CLINICAL DATA: CVA with unsteady gait

EXAM:
MRI HEAD WITHOUT CONTRAST
TECHNIQUE: Multiplanar, multiecho pulse sequences of the brain and surrounding
structures were obtained without intravenous contrast.

[Series 2: T1 · sagittal · 5.0mm · 0.45mm/px · 3 of 29 slices shown (1 of 2)]
[im 1/29]
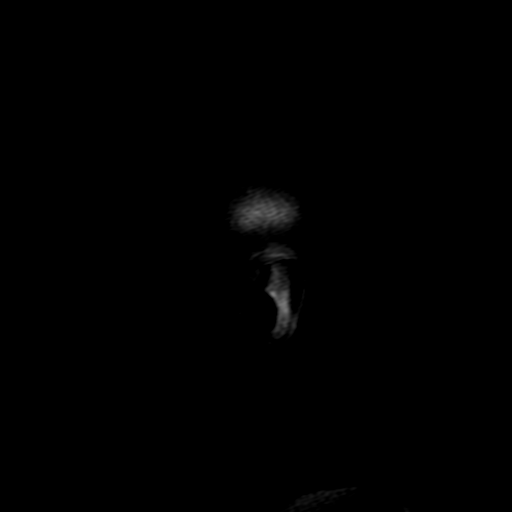
[im 15/29]
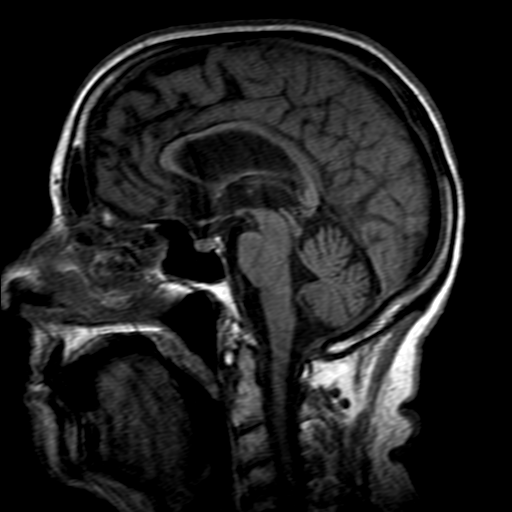
[im 29/29]
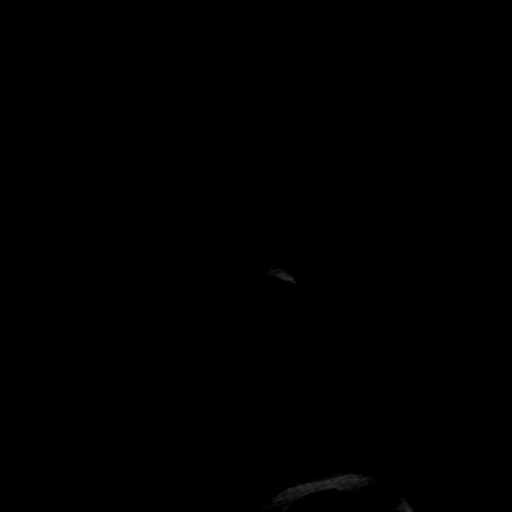

[Series 4: DWI · axial · 4.0mm · 0.94mm/px · z∈[-68,+102]mm · 5 of 44 slices shown (1 of 4)]
[im 1/44]
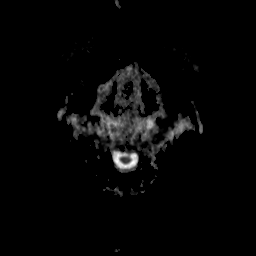
[im 11/44]
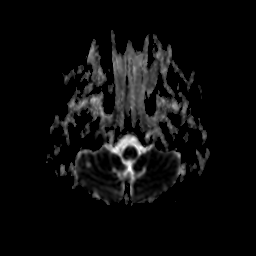
[im 22/44]
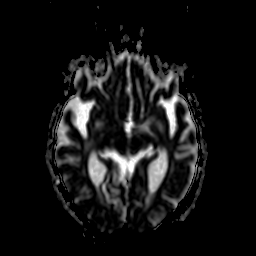
[im 33/44]
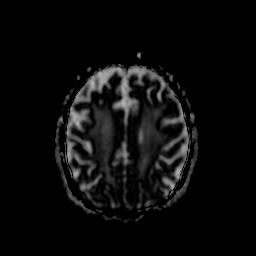
[im 44/44]
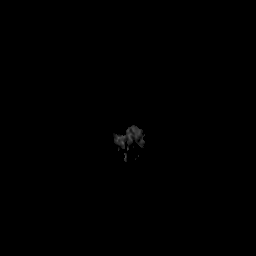

[Series 6: DWI · coronal · 5.0mm · 1.80mm/px · 5 of 39 slices shown (2 of 4)]
[im 1/39]
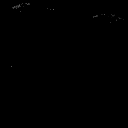
[im 10/39]
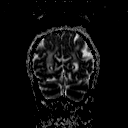
[im 20/39]
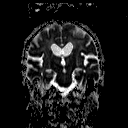
[im 29/39]
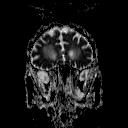
[im 39/39]
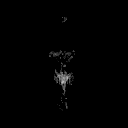

[Series 7: DWI · axial · 4.0mm · 0.94mm/px · z∈[-68,+98]mm · 6 of 43 slices shown (3 of 4)]
[im 1/43]
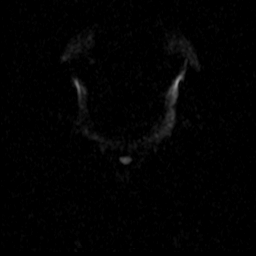
[im 9/43]
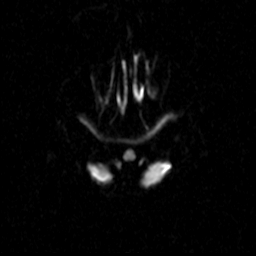
[im 17/43]
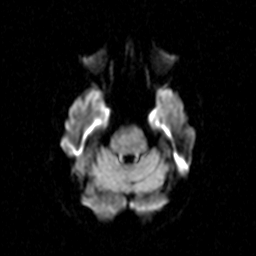
[im 26/43]
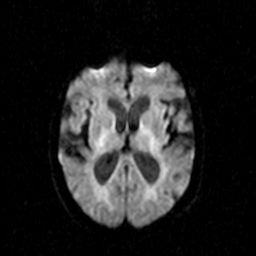
[im 34/43]
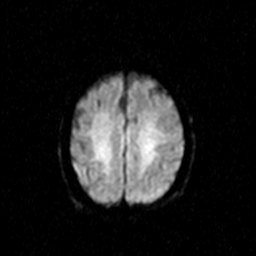
[im 43/43]
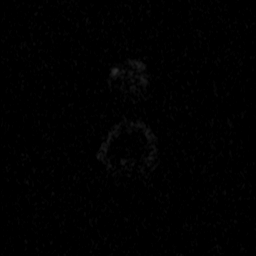

[Series 8: T2 · axial · 5.0mm · 0.45mm/px · z∈[-65,+102]mm · 4 of 27 slices shown (1 of 3)]
[im 1/27]
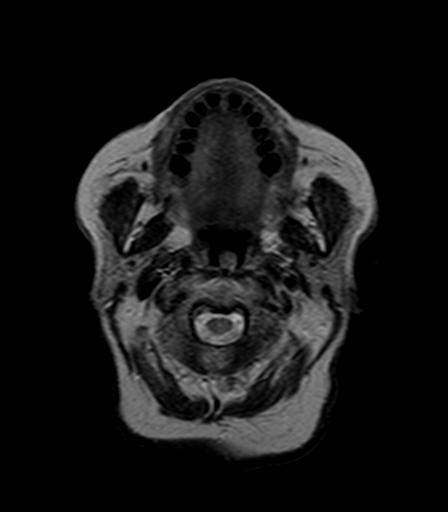
[im 9/27]
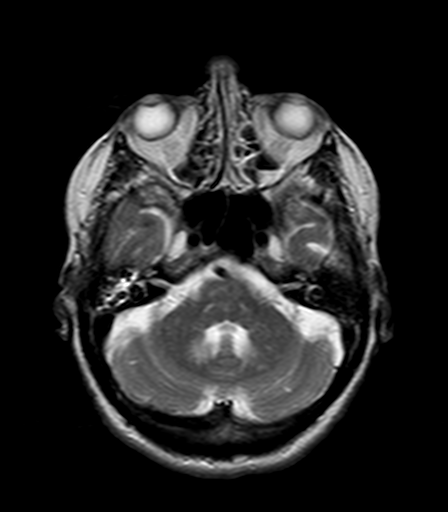
[im 18/27]
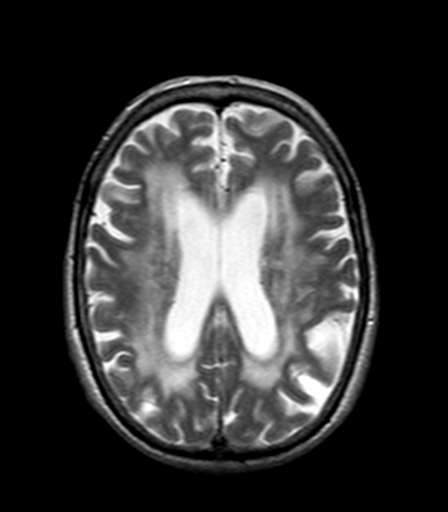
[im 27/27]
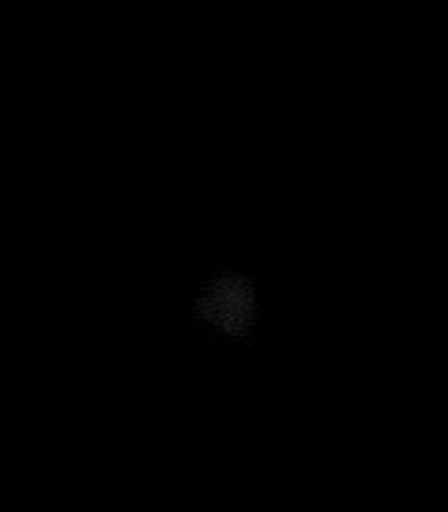

[Series 9: DWI · coronal · 5.0mm · 1.80mm/px · 5 of 38 slices shown (4 of 4)]
[im 1/38]
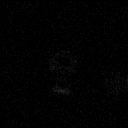
[im 10/38]
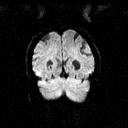
[im 19/38]
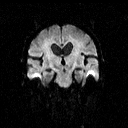
[im 28/38]
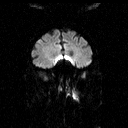
[im 38/38]
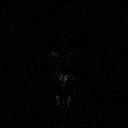

[Series 10: FLAIR · axial · 5.0mm · 0.90mm/px · z∈[-65,+102]mm · 4 of 27 slices shown]
[im 1/27]
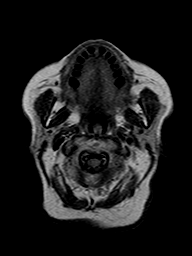
[im 9/27]
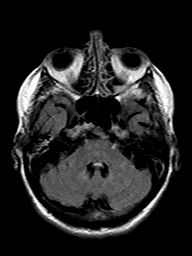
[im 18/27]
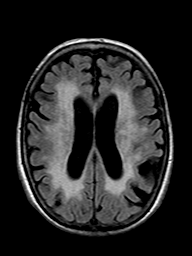
[im 27/27]
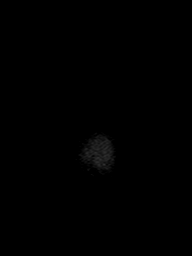

[Series 11: T2 · axial · 5.0mm · 0.45mm/px · z∈[-65,+102]mm · 4 of 27 slices shown (2 of 3)]
[im 1/27]
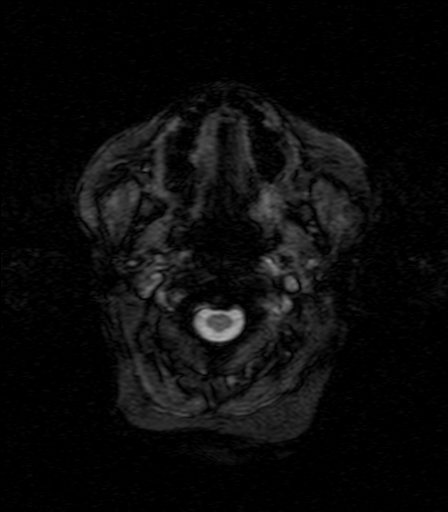
[im 9/27]
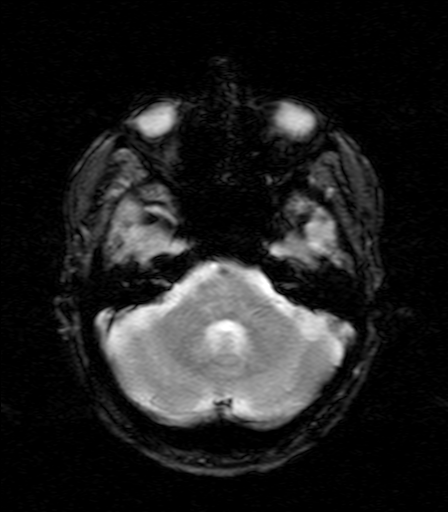
[im 18/27]
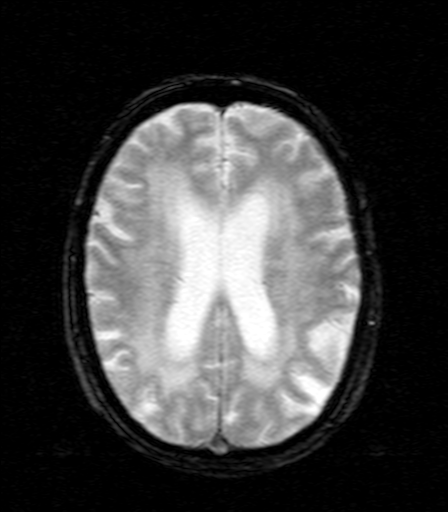
[im 27/27]
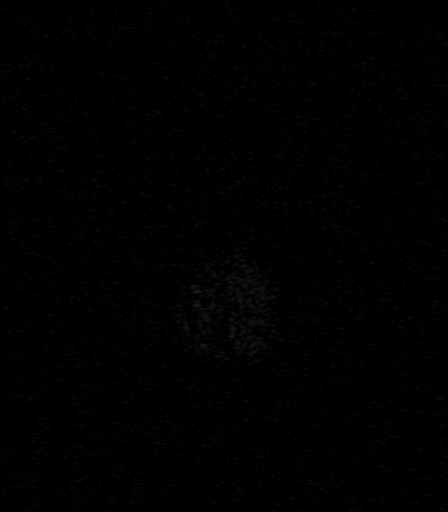

[Series 12: T1 · axial · 3.0mm · 0.45mm/px · 1 of 60 slices shown (2 of 2)]
[im 1/60]
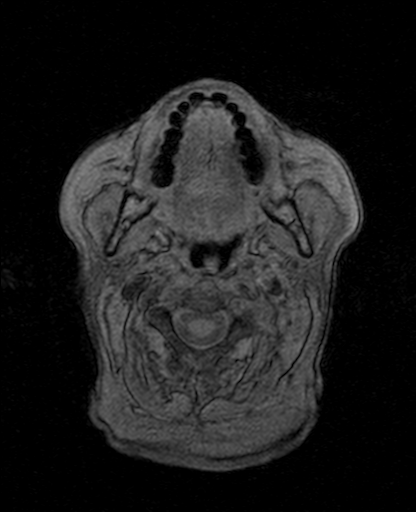

[Series 13: T2 · coronal · 5.0mm · 0.45mm/px · 4 of 29 slices shown (3 of 3)]
[im 1/29]
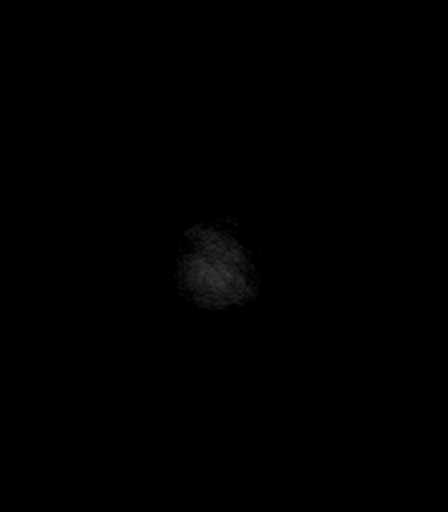
[im 10/29]
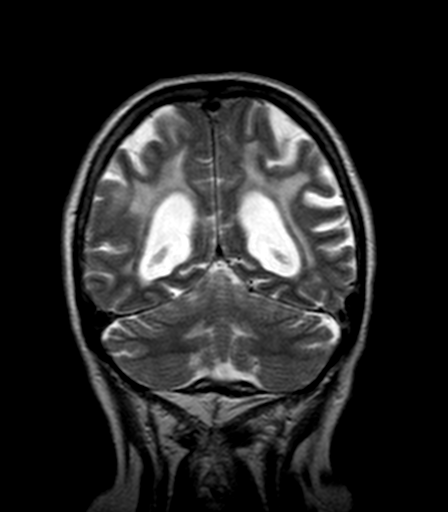
[im 19/29]
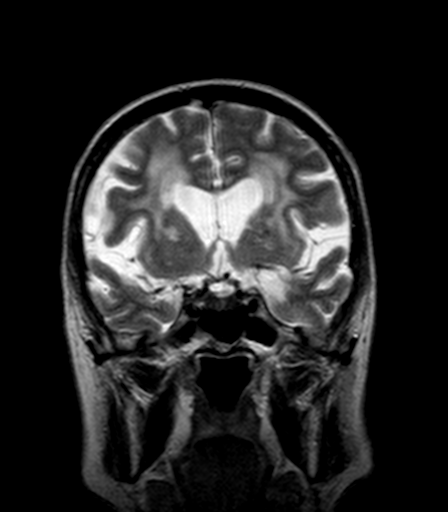
[im 29/29]
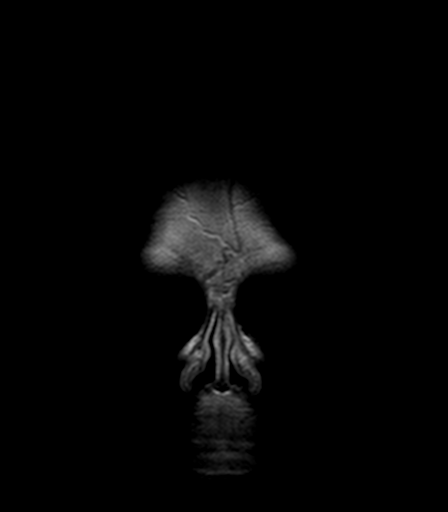

[41 of 48 positions shown; findings below may reference images not displayed]

FINDINGS: Calvarium and upper cervical spine: No focal marrow signal
abnormality. Questionable mild left parietal scalp swelling.

Orbits: Left cataract resection.  No pathologic finding.

Sinuses and Mastoids: Mucosal thickening in bilateral paranasal
sinuses, greatest in the maxillary antra with left-sided fluid
level. Right more than left mastoid fluid and mucosal thickening
with clear nasopharynx.

Brain: No acute abnormality such as acute infarct, hemorrhage,
hydrocephalus, or mass lesion. No evidence of large vessel
occlusion.

There is confluent white matter disease without mass effect in the
bilateral cerebral hemispheres consistent with advanced chronic
small vessel ischemia. These changes are also patchy throughout the
pons and deep gray nuclei. Dilated perivascular spaces in the basal
ganglia, often seen with chronic hypertension at this age.
Generalized atrophy with ventriculomegaly. Multiple remote small
vessel infarcts in the bilateral cerebellum.
IMPRESSION: 1. No acute finding, including infarct.
2. Severe chronic small vessel disease.
3. Chronic sinusitis and mastoiditis with superimposed left
maxillary fluid level.
